# Patient Record
Sex: Male | Born: 1975 | Race: White | Hispanic: No | Marital: Married | Smoking: Never smoker
Health system: Southern US, Community
[De-identification: ages and names within clinical notes are randomized; demographics above are authoritative.]

---

## 2019-04-21 DIAGNOSIS — Z0000 Encounter for general adult medical examination without abnormal findings: Secondary | ICD-10-CM | POA: Diagnosis not present

## 2019-04-21 DIAGNOSIS — M222X2 Patellofemoral disorders, left knee: Secondary | ICD-10-CM | POA: Insufficient documentation

## 2019-05-13 ENCOUNTER — Other Ambulatory Visit: Payer: Self-pay

## 2019-05-13 ENCOUNTER — Ambulatory Visit: Payer: Self-pay

## 2019-05-13 ENCOUNTER — Encounter: Payer: Self-pay | Admitting: Family Medicine

## 2019-05-13 ENCOUNTER — Ambulatory Visit: Payer: BC Managed Care – PPO | Admitting: Family Medicine

## 2019-05-13 VITALS — BP 130/90 | HR 88 | Ht 70.0 in | Wt 169.2 lb

## 2019-05-13 DIAGNOSIS — M25561 Pain in right knee: Secondary | ICD-10-CM | POA: Diagnosis not present

## 2019-05-13 DIAGNOSIS — M25562 Pain in left knee: Secondary | ICD-10-CM

## 2019-05-13 NOTE — Progress Notes (Signed)
Subjective:    CC: B knee pain, L>R  I, Molly Weber, LAT, ATC, am serving as scribe for Dr. Lynne Leader.  HPI: Pt is a 44 y/o male presenting w/ c/o B knee pain L>R  x 4-6 weeks w/ no known MOI.  He states that he has been running over the past year and has recently had increased pain w/ running to the point that he has had to stop running.  He was running up to 20-25 miles/week prior to stopping due to the pain.  He rates his pain at a 4-5/10 and describes his pain as sharp w/ running and stiff and aching when sitting for a prolonged period of time.  He normally runs about 25 miles per week.  He has had to reduce or stop running as a result of his pain.  He does note that about 10 years ago his right knee twisted and he felt a painful pop.  This was sore for a few weeks but then resolved without a lot of pain until recently.  Radiating pain: No Knee swelling: No Mechanical knee symptoms: Yes Aggravating factors: Running; stair climbing; prolonged sitting Treatments tried: Meloxicam  Diagnostic imaging:  Pertinent review of Systems: No fevers or chills  Relevant historical information: No significant past medical history noted in chart.   Objective:    Vitals:   05/13/19 1247  BP: 130/90  Pulse: 88  SpO2: 98%   General: Well Developed, well nourished, and in no acute distress.   MSK:  Right knee: Normal-appearing without effusion erythema. Range of motion 0-120 degrees without crepitation. Mildly tender palpation anterior medial joint line.  Otherwise nontender Stable ligamentous exam. Negative McMurray's test. Intact strength to flexion and extension.  Left knee: Normal-appearing no swelling effusion or erythema. Mildly tender palpation overlying proximal patellar tendon.  Palpable squeak present overlying proximal patellar tendon. Stable ligamentous exam. Negative Murray's test. Intact strength to flexion extension.  Right hip: Normal-appearing Normal  motion. Nontender. Strength 4+/5 abduction.  5/5 external rotation.  5/5 internal rotation and adduction.  Left hip: Normal-appearing Normal motion. Nontender. Strength: 4/5 abduction.  5/5 external rotation.  5/5 internal rotation and adduction.  Leg lengths equal. Normal gait.  Lab and Radiology Results Diagnostic Limited MSK Ultrasound of: Right knee Quad tendon: Intact normal-appearing no significant joint effusion. Patellar tendon intact normal-appearing.  Trace hypoechoic fluid tracking deep to patellar tendon at insertion onto tibia. Medial meniscus with hypoechoic change mid substance consistent with radial tear.  No displacement of meniscus or extrusion of meniscus present. Lateral joint line normal-appearing. Impression: Probable old medial meniscus tear  Diagnostic Limited MSK Ultrasound of: Left knee Quad tendon normal-appearing no significant joint effusion. Patellar tendon is normal-appearing however hypoechoic fluid tracking superficial to patellar tendon consistent with prepatellar bursitis. Medial joint line and lateral joint line normal-appearing with no obvious meniscus tear Impression: Prepatellar bursitis    Impression and Recommendations:    Assessment and Plan: 44 y.o. male with bilateral knee pain.   Left knee pain: Predominantly prepatellar bursitis.  Patient also likely has a component of patellofemoral pain syndrome.  Trial of Voltaren gel.  Work on quad strength and hip abductor strength.   Right knee pain: Ongoing for a few weeks.  Patient has a history that is concerning for possible meniscus tear occurring about 10 years ago.  His pain started recently.  I believe the pain in his right knee is due to compensatory gait from his left knee pain.  He  probably does have a meniscus tear however is not been bothering him previously very much.  Watchful waiting trial Voltaren gel treating left knee pain..  Check back 4 to 6 weeks.  Orders Placed This  Encounter  Procedures  . [place] LIMITED JOINT SPACE STRUCTURES LOW BILAT(NO LINKED CHARGES)    Order Specific Question:   Reason for Exam (SYMPTOM  OR DIAGNOSIS REQUIRED)    Answer:   B knee pain    Order Specific Question:   Preferred imaging location?    Answer:   [facility]   No orders of the defined types were placed in this encounter.   Discussed warning signs or symptoms. Please see discharge instructions. Patient expresses understanding.   The above documentation has been reviewed and is accurate and complete Clementeen Graham

## 2019-05-13 NOTE — Patient Instructions (Addendum)
Thank you for coming in today. I think the issue with the left knee is pre-patellar bursitis and some patellofemoral pain syndrome.  The right knee may have a small medial meniscus tear.  Try using over the counter voltaren gel up to 4 x daily  Avoid lot of pressure or impact on the front of your left knee Do the exercises Molly reviewed.  Ok to do return to running.  Pay attention to pain or limping.  Start at about 50% of pre-injury workload and advance 10% per week.  If not doing well let me know. May get xray and start formal PT.  Recheck in 4-6 weeks.  Let me know sooner if not doing ok.   Please perform the exercise program that we have prepared for you and gone over in detail on a daily basis.  In addition to the handout you were provided you can access your program through: [email or web address]   Your unique program code is:  [email or web address]

## 2019-06-13 ENCOUNTER — Ambulatory Visit (INDEPENDENT_AMBULATORY_CARE_PROVIDER_SITE_OTHER): Payer: BC Managed Care – PPO

## 2019-06-13 ENCOUNTER — Ambulatory Visit: Payer: BC Managed Care – PPO | Admitting: Family Medicine

## 2019-06-13 ENCOUNTER — Other Ambulatory Visit: Payer: Self-pay

## 2019-06-13 ENCOUNTER — Ambulatory Visit: Payer: Self-pay

## 2019-06-13 VITALS — BP 122/88 | Ht 70.0 in | Wt 169.0 lb

## 2019-06-13 DIAGNOSIS — M25562 Pain in left knee: Secondary | ICD-10-CM

## 2019-06-13 DIAGNOSIS — M222X1 Patellofemoral disorders, right knee: Secondary | ICD-10-CM | POA: Insufficient documentation

## 2019-06-13 DIAGNOSIS — M25561 Pain in right knee: Secondary | ICD-10-CM

## 2019-06-13 DIAGNOSIS — M7042 Prepatellar bursitis, left knee: Secondary | ICD-10-CM | POA: Insufficient documentation

## 2019-06-13 DIAGNOSIS — M1711 Unilateral primary osteoarthritis, right knee: Secondary | ICD-10-CM | POA: Diagnosis not present

## 2019-06-13 DIAGNOSIS — M1712 Unilateral primary osteoarthritis, left knee: Secondary | ICD-10-CM | POA: Diagnosis not present

## 2019-06-13 NOTE — Progress Notes (Signed)
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

## 2019-06-13 NOTE — Progress Notes (Signed)
I, [name], LAT, ATC, am serving as scribe for Dr. Clementeen Graham.  Peter Erickson is a 44 y.o. male who presents to [facility] today for follow-up bilateral knee pain.  Patient was seen on February 23 for bilateral knee pain.  Left knee: Thought to be predominantly prepatellar bursitis.  Additionally he was thought to have a component of patellofemoral pain syndrome.  He had trial of Voltaren gel and quad and hip abductor strengthening.  In the interim he notes continued pain mostly at the anterior aspect the knee.  Pain is also obnoxious with kneeling.  Right knee: At the last visit pain would have been ongoing for few weeks.  He was thought to have perhaps an old meniscus tear about 10 years ago and thought pain was possibly due to compensatory gait due to his left knee pain.  Again plan for Voltaren gel and watchful waiting.  In the interim he notes continued pain.  He is unable to run normally because of this knee pain.   Pertinent review of systems: No fevers or chills  Relevant historical information: No significant past medical history noted in chart.   Exam:  BP 122/88   Ht 5\' 10"  (1.778 m)   Wt 169 lb (76.7 kg)   BMI 24.25 kg/m  General: Well Developed, well nourished, and in no acute distress.   MSK:  Right knee: Normal-appearing Normal motion Nontender. Normal strength. Stable ligaments exam. Negative Murray's test.  Left knee: Normal-appearing Tender palpation overlying proximal patellar tendon and distal patella. Minimal palpable squeak present. Stable ligamentous exam.  Negative McMurray's test. Intact flexion extension strength.    Lab and Radiology Results No results found for this or any previous visit (from the past 72 hour(s)). DG Knee AP/LAT W/Sunrise Left  Result Date: 06/13/2019 CLINICAL DATA:  Chronic pain EXAM: LEFT KNEE 3 VIEWS COMPARISON:  None. FINDINGS: Frontal, lateral, tunnel, and sunrise patellar images  were obtained. There is no evident fracture or dislocation. No joint effusion. There is slight narrowing medially. Other joint spaces appear normal. No erosive change or intra-articular calcification. IMPRESSION: Slight joint space narrowing medially. Joint spaces elsewhere appear normal. No evident fracture, dislocation, or joint effusion. Electronically Signed   By: 06/15/2019 III M.D.   On: 06/13/2019 09:11   DG Knee AP/LAT W/Sunrise Right  Result Date: 06/13/2019 CLINICAL DATA:  Chronic pain EXAM: RIGHT KNEE 3 VIEWS COMPARISON:  None. FINDINGS: Frontal, lateral, tunnel, and sunrise patellar images were obtained. No fracture or dislocation. No joint effusion. There is minimal narrowing medially. Other joint spaces appear normal. No erosive change or intra-articular calcification. IMPRESSION: Rather minimal narrowing medially. Joint spaces otherwise appear normal. No fracture, dislocation, or effusion. Electronically Signed   By: 06/15/2019 III M.D.   On: 06/13/2019 09:12  I, 06/15/2019, personally (independently) visualized and performed the interpretation of the images attached in this note.   Procedure: Real-time Ultrasound Guided Injection of left knee prepatellar bursa Device: Philips Affiniti 50G Images permanently stored and available for review in the ultrasound unit. Verbal informed consent obtained.  Discussed risks and benefits of procedure. Warned about infection bleeding damage to structures skin hypopigmentation and fat atrophy among others. Patient expresses understanding and agreement Time-out conducted.   Noted no overlying erythema, induration, or other signs of local infection.   Skin prepped in a sterile fashion.   Local anesthesia: Topical Ethyl chloride.   With sterile technique and under real time ultrasound guidance:  40 mg of Kenalog and 1 mL of Marcaine injected easily.   Completed without difficulty   Pain immediately resolved suggesting accurate  placement of the medication.   Advised to call if fevers/chills, erythema, induration, drainage, or persistent bleeding.   Images permanently stored and available for review in the ultrasound unit.  Impression: Technically successful ultrasound guided injection.        Assessment and Plan: 44 y.o. male with bilateral knee pain Left knee pain: Predominantly due to prepatellar bursitis.  Failing initial conservative management.  Plan for steroid injection today.  Continue Voltaren gel.  Add compressive sleeve body helix full knee.  Add physical therapy.  X-ray obtained today.  Recheck back as needed.  Right knee pain: Femoral pain syndrome.  I suspect part of this is due to compensatory gait due to left knee pain.  Failing initial management.  X-ray obtained today.  Plan for trial of physical therapy.  If not improved would consider MRI to evaluate for possible meniscus injury.  PDMP not reviewed this encounter. Orders Placed This Encounter  Procedures  . [place] LIMITED JOINT SPACE STRUCTURES LOW BILAT(NO LINKED CHARGES)    Order Specific Question:   Reason for Exam (SYMPTOM  OR DIAGNOSIS REQUIRED)    Answer:   B knee pain    Order Specific Question:   Preferred imaging location?    Answer:   [place]  . DG Knee AP/LAT W/Sunrise Left    Standing Status:   Future    Number of Occurrences:   1    Standing Expiration Date:   08/12/2020    Order Specific Question:   Reason for Exam (SYMPTOM  OR DIAGNOSIS REQUIRED)    Answer:   B knee pain    Order Specific Question:   Preferred imaging location?    Answer:   [facility]  . DG Knee AP/LAT W/Sunrise Right    Standing Status:   Future    Number of Occurrences:   1    Standing Expiration Date:   08/12/2020    Order Specific Question:   Reason for Exam (SYMPTOM  OR DIAGNOSIS REQUIRED)    Answer:   B knee pain    Order Specific Question:   Preferred imaging location?    Answer:   [facility]  .  Ambulatory referral to Physical Therapy    Referral Priority:   Routine    Referral Type:   Physical Medicine    Referral Reason:   Specialty Services Required    Requested Specialty:   Physical Therapy    Number of Visits Requested:   1   No orders of the defined types were placed in this encounter.    Discussed warning signs or symptoms. Please see discharge instructions. Patient expresses understanding.   The above documentation has been reviewed and is accurate and complete Lynne Leader

## 2019-06-13 NOTE — Progress Notes (Signed)
Minimal arthritis inside part of the knee otherwise normal

## 2019-06-13 NOTE — Patient Instructions (Signed)
Please get knee x-rays on your way out.  I recommend you obtained a compression sleeve to help with your joint problems. There are many options on the market however I recommend obtaining a knee Body Helix compression sleeve.  You can find information (including how to appropriate measure yourself for sizing) can be found at www.Body [email or web address].ch.  Many of these products are health savings account (HSA) eligible.   You can use the compression sleeve at any time throughout the day but is most important to use while being active as well as for 2 hours post-activity.   It is appropriate to ice following activity with the compression sleeve in place.  We have referred you to outpatient PT at [facility].

## 2019-07-01 ENCOUNTER — Other Ambulatory Visit: Payer: Self-pay

## 2019-07-01 ENCOUNTER — Ambulatory Visit: Payer: BC Managed Care – PPO | Attending: Family Medicine

## 2019-07-01 DIAGNOSIS — M25562 Pain in left knee: Secondary | ICD-10-CM | POA: Insufficient documentation

## 2019-07-01 DIAGNOSIS — R293 Abnormal posture: Secondary | ICD-10-CM | POA: Diagnosis not present

## 2019-07-01 DIAGNOSIS — M25561 Pain in right knee: Secondary | ICD-10-CM | POA: Diagnosis not present

## 2019-07-01 DIAGNOSIS — M6281 Muscle weakness (generalized): Secondary | ICD-10-CM | POA: Diagnosis not present

## 2019-07-01 NOTE — Therapy (Signed)
[facility] [address] [place], [place], [address] Phone: [phone]   Fax:  [phone]  Physical Therapy Evaluation  Patient Details  Name: Peter Erickson MRN: [id number] Date of Birth: Aug 11, 1975 Referring Provider (PT): Jenita Seashore, MD   Encounter Date: 07/01/2019  PT End of Session - 07/01/19 1652    Visit Number  1    Number of Visits  8    Date for PT Re-Evaluation  08/12/19    Authorization Type  BC[employer]    PT Start Time  1315    PT Stop Time  1403    PT Time Calculation (min)  48 min    Activity Tolerance  Patient tolerated treatment well    Behavior During Therapy  [facility] for tasks assessed/performed       History reviewed. No pertinent past medical history.  History reviewed. No pertinent surgical history.  There were no vitals filed for this visit.   Subjective Assessment - 07/01/19 1311    Subjective  Pt reports he started running during the pandemic and went for months until he started having L knee pain. He thought it was his shoes, but it didn't really help. He went for a 6-7 mile run and had excruciating pain in L knee going up/down stairs, but also started having some meidal R knee pain. Ultrasound revealed potential R medial meniscal tear and prepatellar bursitis in L knee. He received an injection in L knee which took about a week to take effect. He is now able to take short runs for a few miles - 2-3 runs since injection with no pain after 1st run of 2 miles and Sunday was a 3 mile run and 3 mile walk back with pain 2 days later. He wears [employer] 860's to run. He reports starting by building to 3 miles during the week and then was up to 4-6 miles, then he was starting at 6 mile long runs on the weekends and ramped it up to 13.    Currently in Pain?  Yes    Pain Score  1     Pain Location  Knee    Pain Orientation  Left    Pain Descriptors / Indicators  Aching    Pain Type  Acute pain    Pain  Onset  More than a month ago         [facility] PT Assessment - 07/01/19 0001      Assessment   Medical Diagnosis  B Knee Pain    Referring Provider (PT)  Jenita Seashore, MD    Onset Date/Surgical Date  03/02/19    Hand Dominance  Right      Balance Screen   Has the patient fallen in the past 6 months  No    Has the patient had a decrease in activity level because of a fear of falling?   No    Is the patient reluctant to leave their home because of a fear of falling?   No      ROM / Strength   AROM / PROM / Strength  AROM;PROM;Strength      AROM   Overall AROM   Within functional limits for tasks performed    Overall AROM Comments  --    AROM Assessment Site  Knee    Right/Left Knee  Right;Left    Right Knee Extension  -2    Right Knee Flexion  138    Left  Knee Extension  -2    Left Knee Flexion  140      PROM   Overall PROM Comments  hyperextension to B knees, Hip WFL, firm end feels with spontaneous manipulation to L hip with PROM hip IR testing    PROM Assessment Site  Knee    Right/Left Knee  Right;Left    Right Knee Extension  -5    Right Knee Flexion  145    Left Knee Extension  -5    Left Knee Flexion  148      Strength   Strength Assessment Site  Hip;Knee    Right/Left Hip  Right;Left    Right Hip Flexion  4/5    Right Hip Extension  4-/5    Right Hip External Rotation   4-/5    Right Hip Internal Rotation  4-/5    Right Hip ABduction  4/5   hip flexor compensation   Left Hip Flexion  4/5    Left Hip Extension  3+/5    Left Hip External Rotation  4-/5    Left Hip Internal Rotation  3+/5    Left Hip ABduction  4-/5    Right/Left Knee  Right;Left    Right Knee Flexion  4+/5    Right Knee Extension  4+/5    Left Knee Flexion  4+/5    Left Knee Extension  4+/5      Flexibility   Soft Tissue Assessment /Muscle Length  yes    Quadriceps  hypoextensibility    ITB  + Ober's, excessive B hip ER in supine lying      Palpation   Patella mobility  hypermobility  L patellar, decreased mobility M/L R patella    Palpation comment  no pain with PROM and OP into flex/ext B knees, TTP to L patella      Ambulation/Gait   Gait Comments  Running Analysis: increased lumbar lordosis, decreased anterior lean with increased vertical translaition, heel striker, increased supination R foot                Objective measurements completed on examination: See above findings.      [facility] PT Treatment/Exercise - 07/01/19 0001      Exercises   Exercises  Knee/Hip      Knee/Hip Exercises: Stretches   Other Knee/Hip Stretches  Dynamic Warm-up: leaning against wall in plank with high knees x 30 seconds, in plank one leg swings into IR/ER allowing the mobility at opposite ankle joint, standing with swings M/L and A/P      Knee/Hip Exercises: Supine   Other Supine Knee/Hip Exercises  PPT 10 x 5" due to excessive anterior tilt              PT Education - 07/01/19 1651    Education Details  HEP, POC, adapting load during running, backing off on running, adding in strengthening/restorative exercise    Person(s) Educated  Patient    Methods  Explanation;Handout;Verbal cues;Tactile cues;Demonstration   emailed HEP   Comprehension  Verbalized understanding;Verbal cues required;Tactile cues required       PT Short Term Goals - 07/01/19 1713      PT SHORT TERM GOAL #1   Title  Pt will demonstrate ability to perform bridge, maintaining posterior pelvic tilt without pain.    Baseline  difficulty achieving posterior tilt in H/L with back imprinted, painful into low bridge    Time  2    Period  Weeks  Status  New    Target Date  07/15/19      PT SHORT TERM GOAL #2   Title  Pt will increase L hip abduction strength to 4+/5 for improved HKA alignment.    Baseline  3+/5    Time  4    Status  New    Target Date  07/29/19      PT SHORT TERM GOAL #3   Title  Pt will increase hip extension strength to 4+/5 B.    Baseline  3+/5 L, 4-/5 R     Time  4    Period  Weeks    Status  New    Target Date  07/29/19        PT Long Term Goals - 07/01/19 1715      PT LONG TERM GOAL #1   Title  Pt will be able to return to running without pain, understanding how to adapt load progressively over time for injury prevention.    Baseline  Pain post run    Time  6    Period  Weeks    Status  New    Target Date  08/12/19      PT LONG TERM GOAL #2   Title  Pt will demonstrate 10 single leg squats B with proper form and mechanics, pain free.    Time  6    Period  Weeks    Status  New    Target Date  08/12/19      PT LONG TERM GOAL #3   Title  Pt will have no knee pain negotiating stairs for home and community access.    Baseline  Pain    Time  6    Period  Weeks    Status  New    Target Date  08/12/19             Plan - 07/01/19 1653    Clinical Impression Statement  Pt is a 44 yo male who presents to clinic with c/o B knee pain, L>R. Pt began running last year during pandemic and progressively increased distance with significant jump from 6 to 13 miles at one point and no report of load adaptation. Pt had abrupt onset of L knee pain with gradual onset of R knee pain, likely overuse in nature. Pt participates in power yoga once/week and minimal stretching along with running 3-4x/week, though recently has cut back due to pain. Pt demonstrates weakness in B hips/posterior chain L>R and core with significant difficulty achieving posterior tilt in supine and c/o low back pain with bridging. He was educated on physical therapy POC, diagnosis, prognosis, and HEP - pt verbalized understanding and consented to treatment.    Personal Factors and Comorbidities  Time since onset of injury/illness/exacerbation;Fitness    Examination-Activity Limitations  Stairs;Locomotion Level;Squat    Examination-Participation Restrictions  Community Activity;Other   running   Stability/Clinical Decision Making  Stable/Uncomplicated    Clinical Decision  Making  Low    Rehab Potential  Excellent    PT Frequency  2x / week   2x/week for 2-4 weeks, 1x/week for 2-4 weeks   PT Duration  6 weeks    PT Treatment/Interventions  Cryotherapy;Electrical Stimulation;Iontophoresis 4mg /ml Dexamethasone;Moist Heat;Stair training;Gait training;Functional mobility training;Therapeutic activities;Neuromuscular re-education;Balance training;Therapeutic exercise;Patient/family education;Dry needling;Passive range of motion;Manual techniques;Joint Manipulations;Vasopneumatic Device    PT Next Visit Plan  Assess HEP, continue to progress static glute and core strength, myofascial restrictions to quads    PT Home  Exercise Plan  [id number] - dynamic warm up for running: plank against wall high knees x 30 seconds, plank against wall and IR/ER hip x 20 B, hip swings AP/ML x 20 each, PPT 1 x 15-20 x 5 seconds    Consulted and Agree with Plan of Care  Patient       Patient will benefit from skilled therapeutic intervention in order to improve the following deficits and impairments:  Hypermobility, Decreased strength, Increased fascial restricitons, Impaired flexibility, Postural dysfunction, Pain, Improper body mechanics, Impaired perceived functional ability, Decreased activity tolerance  Visit Diagnosis: Left knee pain, unspecified chronicity - Plan: PT plan of care cert/re-cert  Right knee pain, unspecified chronicity - Plan: PT plan of care cert/re-cert  Muscle weakness (generalized) - Plan: PT plan of care cert/re-cert  Posture abnormality - Plan: PT plan of care cert/re-cert     Problem List Patient Active Problem List   Diagnosis Date Noted  . Prepatellar bursitis, left knee 06/13/2019  . Patellofemoral pain syndrome of right knee 06/13/2019  . Patellofemoral pain syndrome of left knee 04/21/2019    Marcelline Mates, PT, DPT 07/01/2019, 5:21 PM  [facility] [address] [place], [place],  [address] Phone: [phone]   Fax:  [phone]  Name: Peter Erickson MRN: [id number] Date of Birth: May 20, 1975

## 2019-07-07 ENCOUNTER — Encounter: Payer: Self-pay | Admitting: Physical Therapy

## 2019-07-07 ENCOUNTER — Other Ambulatory Visit: Payer: Self-pay

## 2019-07-07 ENCOUNTER — Ambulatory Visit: Payer: BC Managed Care – PPO | Admitting: Physical Therapy

## 2019-07-07 DIAGNOSIS — R293 Abnormal posture: Secondary | ICD-10-CM | POA: Diagnosis not present

## 2019-07-07 DIAGNOSIS — M6281 Muscle weakness (generalized): Secondary | ICD-10-CM

## 2019-07-07 DIAGNOSIS — M25561 Pain in right knee: Secondary | ICD-10-CM

## 2019-07-07 DIAGNOSIS — M25562 Pain in left knee: Secondary | ICD-10-CM | POA: Diagnosis not present

## 2019-07-07 NOTE — Patient Instructions (Signed)
Access Code: [email or web address] URL: [email or web address] Date: 07/07/2019 Prepared by: Addilyne Backs  Exercises The Diver - 3-4 x weekly - 3 sets - 10 reps Single Leg Sit to Stand with Arms Extended - 3-4 x weekly - 3 sets - 10 reps Side Plank on Elbow with Hip Abduction - 3-4 x weekly - 3 sets - 10 reps  

## 2019-07-07 NOTE — Therapy (Signed)
[facility] [place] [address] [place], [place], [address] Phone: [phone]   Fax:  [phone]  Physical Therapy Treatment  Patient Details  Name: Peter Erickson MRN: [id number] Date of Birth: 09-07-1975 Referring Provider (PT): Peter Seashore, MD   Encounter Date: 07/07/2019  PT End of Session - 07/07/19 1020    Visit Number  2    Number of Visits  8    Date for PT Re-Evaluation  08/12/19    Authorization Type  BCBS    PT Start Time  1021    PT Stop Time  1103    PT Time Calculation (min)  42 min    Activity Tolerance  Patient tolerated treatment well    Behavior During Therapy  [facility] for tasks assessed/performed       History reviewed. No pertinent past medical history.  History reviewed. No pertinent surgical history.  There were no vitals filed for this visit.  Subjective Assessment - 07/07/19 1021    Subjective  Pt reports he is doing his HEP without problems.  He went hiking over the weekend, strenous and has some Rt groin issues now.  Knees are good today    Currently in Pain?  Yes    Pain Score  3     Pain Location  Groin    Pain Orientation  Right    Pain Descriptors / Indicators  Aching    Pain Type  Acute pain    Pain Onset  In the past 7 days    Pain Frequency  Intermittent                       [facility] Adult PT Treatment/Exercise - 07/07/19 0001      Knee/Hip Exercises: Stretches   Lobbyist  Both;2 reps;60 seconds   prone with strap   ITB Stretch  Both;1 rep;30 seconds   cross body with strap     Knee/Hip Exercises: Aerobic   Elliptical  L5x4' alt FWD/BWD      Knee/Hip Exercises: Standing   SLS  with FWD leans 2x10    SLS with Vectors  10 reps      Knee/Hip Exercises: Seated   Sit to Sand  20 reps;without UE support   each side, higher bed for Lt LE d/t pain and form     Knee/Hip Exercises: Sidelying   Other Sidelying Knee/Hip Exercises  side plank with hip abdcution               PT Education - 07/07/19 1052    Education Details  HEP progression    Person(s) Educated  Patient    Methods  Explanation;Demonstration    Comprehension  Verbalized understanding;Returned demonstration       PT Short Term Goals - 07/01/19 1713      PT SHORT TERM GOAL #1   Title  Pt will demonstrate ability to perform bridge, maintaining posterior pelvic tilt without pain.    Baseline  difficulty achieving posterior tilt in H/L with back imprinted, painful into low bridge    Time  2    Period  Weeks    Status  New    Target Date  07/15/19      PT SHORT TERM GOAL #2   Title  Pt will increase L hip abduction strength to 4+/5 for improved HKA alignment.    Baseline  3+/5    Time  4    Status  New    Target Date  07/29/19      PT SHORT TERM GOAL #3   Title  Pt will increase hip extension strength to 4+/5 B.    Baseline  3+/5 L, 4-/5 R    Time  4    Period  Weeks    Status  New    Target Date  07/29/19        PT Long Term Goals - 07/01/19 1715      PT LONG TERM GOAL #1   Title  Pt will be able to return to running without pain, understanding how to adapt load progressively over time for injury prevention.    Baseline  Pain post run    Time  6    Period  Weeks    Status  New    Target Date  08/12/19      PT LONG TERM GOAL #2   Title  Pt will demonstrate 10 single leg squats B with proper form and mechanics, pain free.    Time  6    Period  Weeks    Status  New    Target Date  08/12/19      PT LONG TERM GOAL #3   Title  Pt will have no knee pain negotiating stairs for home and community access.    Baseline  Pain    Time  6    Period  Weeks    Status  New    Target Date  08/12/19            Plan - 07/07/19 1100    Clinical Impression Statement  This is Marks first tx since the eval.  He aggrivated his Rt groin hiking over the weekend, it did not flare up with any of the exercise we did today.  His Lt LE is very weak compared to the Rt  and has a lot of instabilty with SLS activites.    Rehab Potential  Excellent    PT Frequency  2x / week    PT Duration  6 weeks    PT Treatment/Interventions  Cryotherapy;Electrical Stimulation;Iontophoresis 4mg /ml Dexamethasone;Moist Heat;Stair training;Gait training;Functional mobility training;Therapeutic activities;Neuromuscular re-education;Balance training;Therapeutic exercise;Patient/family education;Dry needling;Passive range of motion;Manual techniques;Joint Manipulations;Vasopneumatic Device    PT Next Visit Plan  Assess HEP, continue to progress static glute and core strength, myofascial restrictions to quads    Consulted and Agree with Plan of Care  Patient       Patient will benefit from skilled therapeutic intervention in order to improve the following deficits and impairments:  Hypermobility, Decreased strength, Increased fascial restricitons, Impaired flexibility, Postural dysfunction, Pain, Improper body mechanics, Impaired perceived functional ability, Decreased activity tolerance  Visit Diagnosis: Left knee pain, unspecified chronicity  Right knee pain, unspecified chronicity  Muscle weakness (generalized)  Posture abnormality     Problem List Patient Active Problem List   Diagnosis Date Noted  . Prepatellar bursitis, left knee 06/13/2019  . Patellofemoral pain syndrome of right knee 06/13/2019  . Patellofemoral pain syndrome of left knee 04/21/2019    Jeral Pinch PT 07/07/2019, 11:06 AM  [facility] [place], [place], [address] Phone: [phone]   Fax:  [phone]  Name: Peter Erickson MRN: [id number] Date of Birth: 01/16/1976

## 2019-07-09 ENCOUNTER — Ambulatory Visit: Payer: BC Managed Care – PPO | Admitting: Physical Therapy

## 2019-07-09 ENCOUNTER — Encounter: Payer: Self-pay | Admitting: Physical Therapy

## 2019-07-09 ENCOUNTER — Other Ambulatory Visit: Payer: Self-pay

## 2019-07-09 DIAGNOSIS — M25562 Pain in left knee: Secondary | ICD-10-CM | POA: Diagnosis not present

## 2019-07-09 DIAGNOSIS — M25561 Pain in right knee: Secondary | ICD-10-CM

## 2019-07-09 DIAGNOSIS — R293 Abnormal posture: Secondary | ICD-10-CM | POA: Diagnosis not present

## 2019-07-09 DIAGNOSIS — M6281 Muscle weakness (generalized): Secondary | ICD-10-CM

## 2019-07-09 NOTE — Therapy (Signed)
[facility] [place] [address] [place], [place], [address] Phone: [phone]   Fax:  [phone]  Physical Therapy Treatment  Patient Details  Name: Peter Erickson MRN: [id number] Date of Birth: [birth year] Referring Provider (PT): Jenita Seashore, MD   Encounter Date: 07/09/2019  PT End of Session - 07/09/19 1407    Visit Number  3    Number of Visits  8    Date for PT Re-Evaluation  08/12/19    Authorization Type  BCBS    PT Start Time  1315    PT Stop Time  1400    PT Time Calculation (min)  45 min    Activity Tolerance  Patient tolerated treatment well    Behavior During Therapy  [facility] for tasks assessed/performed       History reviewed. No pertinent past medical history.  History reviewed. No pertinent surgical history.  There were no vitals filed for this visit.  Subjective Assessment - 07/09/19 1314    Subjective  Pt reports the pain in his R groin has resolved; states L knee is a little sore after single leg squats during last rx.    Currently in Pain?  Yes    Pain Score  3     Pain Location  Knee    Pain Orientation  Left                       [facility] Adult PT Treatment/Exercise - 07/09/19 0001      Knee/Hip Exercises: Stretches   Quad Stretch  Both;60 seconds;1 rep      Knee/Hip Exercises: Aerobic   Elliptical  L5x6' alt fwd/bwd      Knee/Hip Exercises: Machines for Strengthening   Cybex Leg Press  60# BLE 2x10, 40# SLE 2x10 bilat      Knee/Hip Exercises: Standing   Heel Raises  Both;1 set;15 reps    Step Down  Both;2 sets;10 reps;Step Height: 6"    Step Down Limitations  cues for eccentric control on L. Height changed from 8" to 6" d/t pt report of L knee pain    Functional Squat  2 sets;10 reps    Functional Squat Limitations  single limb squat to mat table (pillows to raise height d/t reports of L knee pain)    SLS  with FWD leans 2x10    Walking with Sports Cord  resisted walking  50# each direction x3    Other Standing Knee Exercises  bosu ball squat x10 with cues to limit depth d/t L knee pain      Knee/Hip Exercises: Supine   Bridges  Both;1 set;10 reps   with cues for PPT and core activation   Single Leg Bridge  Both;1 set;10 reps    Other Supine Knee/Hip Exercises  Birddog with 5 sec hold x5 reps bilat      Knee/Hip Exercises: Sidelying   Other Sidelying Knee/Hip Exercises  side plank with hip abduction             PT Education - 07/09/19 1406    Education Details  Discussed HEP progression. Pt educated on running progresssion at home (encouraged to stop before point of pain in L knee).    Person(s) Educated  Patient    Methods  Explanation    Comprehension  Verbalized understanding       PT Short Term Goals - 07/01/19 1713      PT SHORT TERM GOAL #1  Title  Pt will demonstrate ability to perform bridge, maintaining posterior pelvic tilt without pain.    Baseline  difficulty achieving posterior tilt in H/L with back imprinted, painful into low bridge    Time  2    Period  Weeks    Status  New    Target Date  07/15/19      PT SHORT TERM GOAL #2   Title  Pt will increase L hip abduction strength to 4+/5 for improved HKA alignment.    Baseline  3+/5    Time  4    Status  New    Target Date  07/29/19      PT SHORT TERM GOAL #3   Title  Pt will increase hip extension strength to 4+/5 B.    Baseline  3+/5 L, 4-/5 R    Time  4    Period  Weeks    Status  New    Target Date  07/29/19        PT Long Term Goals - 07/01/19 1715      PT LONG TERM GOAL #1   Title  Pt will be able to return to running without pain, understanding how to adapt load progressively over time for injury prevention.    Baseline  Pain post run    Time  6    Period  Weeks    Status  New    Target Date  08/12/19      PT LONG TERM GOAL #2   Title  Pt will demonstrate 10 single leg squats B with proper form and mechanics, pain free.    Time  6    Period  Weeks     Status  New    Target Date  08/12/19      PT LONG TERM GOAL #3   Title  Pt will have no knee pain negotiating stairs for home and community access.    Baseline  Pain    Time  6    Period  Weeks    Status  New    Target Date  08/12/19            Plan - 07/09/19 1408    Clinical Impression Statement  Pt reports that R groin pain has resolved; had to alter SLS at home d/t L knee pain during exercise. Progressed LE strengthening ex this rx; pt tolerated well. Pt reports being able to run ~45mile before L knee pain begins; continue working towards progression of running program.    PT Treatment/Interventions  Cryotherapy;Electrical Stimulation;Iontophoresis 4mg /ml Dexamethasone;Moist Heat;Stair training;Gait training;Functional mobility training;Therapeutic activities;Neuromuscular re-education;Balance training;Therapeutic exercise;Patient/family education;Dry needling;Passive range of motion;Manual techniques;Joint Manipulations;Vasopneumatic Device    PT Next Visit Plan  Assess HEP, continue to progress static glute and core strength, myofascial restrictions to quads. Initiate running training/analysis in clinic as indicated.    Consulted and Agree with Plan of Care  Patient       Patient will benefit from skilled therapeutic intervention in order to improve the following deficits and impairments:  Hypermobility, Decreased strength, Increased fascial restricitons, Impaired flexibility, Postural dysfunction, Pain, Improper body mechanics, Impaired perceived functional ability, Decreased activity tolerance  Visit Diagnosis: Left knee pain, unspecified chronicity  Right knee pain, unspecified chronicity  Muscle weakness (generalized)  Posture abnormality     Problem List Patient Active Problem List   Diagnosis Date Noted  . Prepatellar bursitis, left knee 06/13/2019  . Patellofemoral pain syndrome of right knee 06/13/2019  . Patellofemoral pain  syndrome of left knee  04/21/2019   Amador Cunas, PT, DPT Donald Prose Kamylah Manzo 07/09/2019, 2:12 PM  [place], [place], [address] Phone: [phone]   Fax:  [phone]  Name: Chance Munter MRN: [id number] Date of Birth: [birth year]

## 2019-07-14 ENCOUNTER — Other Ambulatory Visit: Payer: Self-pay

## 2019-07-14 ENCOUNTER — Encounter: Payer: Self-pay | Admitting: Physical Therapy

## 2019-07-14 ENCOUNTER — Ambulatory Visit: Payer: BC Managed Care – PPO | Admitting: Physical Therapy

## 2019-07-14 DIAGNOSIS — M6281 Muscle weakness (generalized): Secondary | ICD-10-CM | POA: Diagnosis not present

## 2019-07-14 DIAGNOSIS — M25562 Pain in left knee: Secondary | ICD-10-CM

## 2019-07-14 DIAGNOSIS — M25561 Pain in right knee: Secondary | ICD-10-CM

## 2019-07-14 DIAGNOSIS — R293 Abnormal posture: Secondary | ICD-10-CM

## 2019-07-14 NOTE — Patient Instructions (Signed)
[id number]: Emailed pt the following program of daily stretches to be incorporated into flexibility routine along with pre/post run stretches.  Provided pt with the following return to running protocol and instructed to start on Level 3 or 4: LEVEL 1 0.1 mile walk / 0.1 mile jog- repeat 10 times LEVEL 2 Alternate 0.1 mile walk / 0.2 mile jog - 2 mile total  LEVEL 3 Alternate 0.1 mile walk / 0.3 mile jog - 2 mile total LEVEL 4 Alternate 0.1 mile walk / 0.4 mile jog - 2 mile total LEVEL 5 jog 2 miles LEVEL 6 Increase workout to 2 1/2 miles LEVEL 7 Increase workout to 3 miles . LEVEL 8 Alternate between running /jogging every 0.25 miles Instructions . Mandatory 2 day rest between workouts for first two weeks. . Do not advance more than 2 levels per week.  . Two days rest mandatory between levels 1, 2, and 3 workouts. . One day rest mandatory between levels 4-8 workouts. Soreness Rules . If sore during warm-up, take 2 days off and drop down 1 level. . If sore during workout, take 1 day off and drop down 1 level. . If sore after workout, stay at same level.

## 2019-07-14 NOTE — Therapy (Signed)
[facility] [place] [address] [place], [place], [address] Phone: [phone]   Fax:  [phone]  Physical Therapy Treatment  Patient Details  Name: Peter Erickson MRN: [id number] Date of Birth: 08/22/1975 Referring Provider (PT): Jenita Seashore, MD   Encounter Date: 07/14/2019  PT End of Session - 07/14/19 1023    Visit Number  4    Number of Visits  8    Date for PT Re-Evaluation  08/12/19    Authorization Type  BCBS    PT Start Time  0926    PT Stop Time  1014    PT Time Calculation (min)  48 min    Activity Tolerance  Patient tolerated treatment well    Behavior During Therapy  [facility] for tasks assessed/performed       History reviewed. No pertinent past medical history.  History reviewed. No pertinent surgical history.  There were no vitals filed for this visit.  Subjective Assessment - 07/14/19 1019    Subjective  Pt reports L knee is feeling much better just a little sore from working out. Pt states that he was able to run one mile yesterday on a flat, paved surface outside with no increase in knee pain during or after.    Currently in Pain?  No/denies    Pain Score  0-No pain    Pain Location  Knee                       [facility] Adult PT Treatment/Exercise - 07/14/19 0001      Knee/Hip Exercises: Stretches   Gastroc Stretch  30 seconds;Both    Gastroc Stretch Limitations  standing      Knee/Hip Exercises: Machines for Strengthening   Cybex Leg Press  60# BLE 2x10, 40# SLE 2x10 bilat      Knee/Hip Exercises: Standing   Heel Raises  Both;1 set;15 reps    Heel Raises Limitations  60#    Step Down  Both;2 sets;10 reps;Step Height: 6"    Step Down Limitations  cues for eccentric control on L. Height changed from 8" to 6" d/t pt report of L knee pain    Functional Squat  2 sets;10 reps    Functional Squat Limitations  SLS to chair with blue mat to raise height d/t pt knee pain    Walking with  Sports Cord  resisted walking 50# sidestepping B x3    Other Standing Knee Exercises  bosu ball squat x10 with cues to limit depth d/t L knee pain   added blue TB above knees   Other Standing Knee Exercises  SLS on dyna disc x30 sec B      Knee/Hip Exercises: Seated   Stool Scoot - Round Trips  2 laps around clinic             PT Education - 07/14/19 1022    Education Details  See pt instructions for added stretches to flexibility HEP and running progression protocol.    Person(s) Educated  Patient    Methods  Explanation    Comprehension  Verbalized understanding       PT Short Term Goals - 07/01/19 1713      PT SHORT TERM GOAL #1   Title  Pt will demonstrate ability to perform bridge, maintaining posterior pelvic tilt without pain.    Baseline  difficulty achieving posterior tilt in H/L with back imprinted, painful into low bridge  Time  2    Period  Weeks    Status  New    Target Date  07/15/19      PT SHORT TERM GOAL #2   Title  Pt will increase L hip abduction strength to 4+/5 for improved HKA alignment.    Baseline  3+/5    Time  4    Status  New    Target Date  07/29/19      PT SHORT TERM GOAL #3   Title  Pt will increase hip extension strength to 4+/5 B.    Baseline  3+/5 L, 4-/5 R    Time  4    Period  Weeks    Status  New    Target Date  07/29/19        PT Long Term Goals - 07/01/19 1715      PT LONG TERM GOAL #1   Title  Pt will be able to return to running without pain, understanding how to adapt load progressively over time for injury prevention.    Baseline  Pain post run    Time  6    Period  Weeks    Status  New    Target Date  08/12/19      PT LONG TERM GOAL #2   Title  Pt will demonstrate 10 single leg squats B with proper form and mechanics, pain free.    Time  6    Period  Weeks    Status  New    Target Date  08/12/19      PT LONG TERM GOAL #3   Title  Pt will have no knee pain negotiating stairs for home and community  access.    Baseline  Pain    Time  6    Period  Weeks    Status  New    Target Date  08/12/19            Plan - 07/14/19 1024    Clinical Impression Statement  Pt progressing well with TE; improved performance with SLS exercises. Pt still experiencing L knee pain when attempting to increase depth of single limb squats. Revised pt HEP to include daily flexibility ex's focused on dynamic and static stretching of quads, hamstrings, IT band, and gastroc. See pt instructions for return to running protocol; working towards progressing pt mileage from 1 mile to 2 miles.    PT Treatment/Interventions  Cryotherapy;Electrical Stimulation;Iontophoresis 4mg /ml Dexamethasone;Moist Heat;Stair training;Gait training;Functional mobility training;Therapeutic activities;Neuromuscular re-education;Balance training;Therapeutic exercise;Patient/family education;Dry needling;Passive range of motion;Manual techniques;Joint Manipulations;Vasopneumatic Device    PT Next Visit Plan  Assess HEP, continue to progress static glute and core strength, myofascial restrictions to quads. Initiate running training/analysis in clinic as indicated.    PT Home Exercise Plan  [id number] - dynamic warm up for running: plank against wall high knees x 30 seconds, plank against wall and IR/ER hip x 20 B, hip swings AP/ML x 20 each, PPT 1 x 15-20 x 5 seconds       Patient will benefit from skilled therapeutic intervention in order to improve the following deficits and impairments:  Hypermobility, Decreased strength, Increased fascial restricitons, Impaired flexibility, Postural dysfunction, Pain, Improper body mechanics, Impaired perceived functional ability, Decreased activity tolerance  Visit Diagnosis: Left knee pain, unspecified chronicity  Right knee pain, unspecified chronicity  Muscle weakness (generalized)  Posture abnormality     Problem List Patient Active Problem List   Diagnosis Date Noted  . Prepatellar  bursitis, left  knee 06/13/2019  . Patellofemoral pain syndrome of right knee 06/13/2019  . Patellofemoral pain syndrome of left knee 04/21/2019   Lysle Rubens, PT, DPT Maryanna Shape Vanessia Bokhari 07/14/2019, 10:28 AM  [facility] [place] [facility] [address] [place], [place], [address] Phone: [phone]   Fax:  [phone]  Name: Peter Erickson MRN: [id number] Date of Birth: 1975-08-27

## 2019-07-16 ENCOUNTER — Encounter: Payer: Self-pay | Admitting: Physical Therapy

## 2019-07-16 ENCOUNTER — Other Ambulatory Visit: Payer: Self-pay

## 2019-07-16 ENCOUNTER — Ambulatory Visit: Payer: BC Managed Care – PPO | Admitting: Physical Therapy

## 2019-07-16 DIAGNOSIS — R293 Abnormal posture: Secondary | ICD-10-CM | POA: Diagnosis not present

## 2019-07-16 DIAGNOSIS — M25561 Pain in right knee: Secondary | ICD-10-CM | POA: Diagnosis not present

## 2019-07-16 DIAGNOSIS — M6281 Muscle weakness (generalized): Secondary | ICD-10-CM | POA: Diagnosis not present

## 2019-07-16 DIAGNOSIS — M25562 Pain in left knee: Secondary | ICD-10-CM

## 2019-07-16 NOTE — Therapy (Signed)
[facility] [place] [address] [place], [place], [address] Phone: [phone]   Fax:  [phone]  Physical Therapy Treatment  Patient Details  Name: Peter Erickson MRN: [id number] Date of Birth: 1975/10/26 Referring Provider (PT): Jenita Seashore, MD   Encounter Date: 07/16/2019  PT End of Session - 07/16/19 1402    Visit Number  5    Number of Visits  8    Date for PT Re-Evaluation  08/12/19    Authorization Type  BCBS    PT Start Time  1315    PT Stop Time  1400    PT Time Calculation (min)  45 min    Activity Tolerance  Patient tolerated treatment well    Behavior During Therapy  [facility] for tasks assessed/performed       History reviewed. No pertinent past medical history.  History reviewed. No pertinent surgical history.  There were no vitals filed for this visit.      [facility] PT Assessment - 07/16/19 0001      Functional Tests   Functional tests  Running      Running   Comments  Pt completed 1 mile with 3 minute warmup followed by alternating 1 minute walking, 5 minutes running                   [facility] Adult PT Treatment/Exercise - 07/16/19 0001      Knee/Hip Exercises: Stretches   Gastroc Stretch  30 seconds;Both    Gastroc Stretch Limitations  standing    Soleus Stretch  Both;30 seconds    Soleus Stretch Limitations  standing      Knee/Hip Exercises: Standing   Hip Abduction  Both;2 sets;10 reps   10# on multiuse machine   Hip Extension  Both;2 sets;10 reps   10# 2 sets on multiuse machine   Step Down  Both;2 sets;10 reps;Step Height: 6"    Step Down Limitations  cues for eccentric control on R; pt reports some tightness on outside of R knee    Functional Squat  2 sets;10 reps    Functional Squat Limitations  SLS to chair with one pillow to raise height d/t pt complaints of knee pain    Lunge Walking - Round Trips  down and back to door x2               PT Short Term Goals -  07/01/19 1713      PT SHORT TERM GOAL #1   Title  Pt will demonstrate ability to perform bridge, maintaining posterior pelvic tilt without pain.    Baseline  difficulty achieving posterior tilt in H/L with back imprinted, painful into low bridge    Time  2    Period  Weeks    Status  New    Target Date  07/15/19      PT SHORT TERM GOAL #2   Title  Pt will increase L hip abduction strength to 4+/5 for improved HKA alignment.    Baseline  3+/5    Time  4    Status  New    Target Date  07/29/19      PT SHORT TERM GOAL #3   Title  Pt will increase hip extension strength to 4+/5 B.    Baseline  3+/5 L, 4-/5 R    Time  4    Period  Weeks    Status  New    Target Date  07/29/19  PT Long Term Goals - 07/01/19 1715      PT LONG TERM GOAL #1   Title  Pt will be able to return to running without pain, understanding how to adapt load progressively over time for injury prevention.    Baseline  Pain post run    Time  6    Period  Weeks    Status  New    Target Date  08/12/19      PT LONG TERM GOAL #2   Title  Pt will demonstrate 10 single leg squats B with proper form and mechanics, pain free.    Time  6    Period  Weeks    Status  New    Target Date  08/12/19      PT LONG TERM GOAL #3   Title  Pt will have no knee pain negotiating stairs for home and community access.    Baseline  Pain    Time  6    Period  Weeks    Status  New    Target Date  08/12/19            Plan - 07/16/19 1403    Clinical Impression Statement  Pt completed 1 mile of running in clinic today at speed of 5.0 mph alternating running/walking with no reports of increase knee pain. Pt demonstrated decreased trunk rotation on L and overpronation during stance phase bilaterally. Educated pt on inserts to correct overpronation and advised to trial one with running and see if it helps. Pt single leg squat is progressing; pt still reports B knee pain with deep squats. Pt advised to continue return to  running protocol; follow up on progress next rx. Continue LE strengthening/flexibility ex's.    PT Treatment/Interventions  Cryotherapy;Electrical Stimulation;Iontophoresis 4mg /ml Dexamethasone;Moist Heat;Stair training;Gait training;Functional mobility training;Therapeutic activities;Neuromuscular re-education;Balance training;Therapeutic exercise;Patient/family education;Dry needling;Passive range of motion;Manual techniques;Joint Manipulations;Vasopneumatic Device    PT Next Visit Plan  Progress LE strengthening/flexibility static and dynamic ex's, manual as indicated, return to running training/analysis as indicated.    PT Home Exercise Plan  [id number] - dynamic warm up for running: plank against wall high knees x 30 seconds, plank against wall and IR/ER hip x 20 B, hip swings AP/ML x 20 each, PPT 1 x 15-20 x 5 seconds    Consulted and Agree with Plan of Care  Patient       Patient will benefit from skilled therapeutic intervention in order to improve the following deficits and impairments:  Hypermobility, Decreased strength, Increased fascial restricitons, Impaired flexibility, Postural dysfunction, Pain, Improper body mechanics, Impaired perceived functional ability, Decreased activity tolerance  Visit Diagnosis: Left knee pain, unspecified chronicity  Right knee pain, unspecified chronicity  Muscle weakness (generalized)  Posture abnormality     Problem List Patient Active Problem List   Diagnosis Date Noted  . Prepatellar bursitis, left knee 06/13/2019  . Patellofemoral pain syndrome of right knee 06/13/2019  . Patellofemoral pain syndrome of left knee 04/21/2019   Amador Cunas, PT, DPT Donald Prose Elysabeth Aust 07/16/2019, 2:06 PM  [place], [place], [address] Phone: [phone]   Fax:  [phone]  Name: Peter Erickson MRN: [id number] Date of Birth: 28-Jan-1976

## 2019-07-22 ENCOUNTER — Other Ambulatory Visit: Payer: Self-pay

## 2019-07-22 ENCOUNTER — Ambulatory Visit: Payer: BC Managed Care – PPO | Attending: Family Medicine | Admitting: Physical Therapy

## 2019-07-22 DIAGNOSIS — R293 Abnormal posture: Secondary | ICD-10-CM | POA: Diagnosis not present

## 2019-07-22 DIAGNOSIS — M7042 Prepatellar bursitis, left knee: Secondary | ICD-10-CM | POA: Diagnosis not present

## 2019-07-22 DIAGNOSIS — M6281 Muscle weakness (generalized): Secondary | ICD-10-CM | POA: Diagnosis not present

## 2019-07-22 DIAGNOSIS — M222X1 Patellofemoral disorders, right knee: Secondary | ICD-10-CM | POA: Insufficient documentation

## 2019-07-22 DIAGNOSIS — M25562 Pain in left knee: Secondary | ICD-10-CM | POA: Insufficient documentation

## 2019-07-22 DIAGNOSIS — M25561 Pain in right knee: Secondary | ICD-10-CM | POA: Diagnosis not present

## 2019-07-22 DIAGNOSIS — M222X2 Patellofemoral disorders, left knee: Secondary | ICD-10-CM | POA: Diagnosis not present

## 2019-07-22 NOTE — Therapy (Signed)
[facility] [place] [address] [place], [place], [address] Phone: [phone]   Fax:  [phone]  Physical Therapy Treatment  Patient Details  Name: Avion Kutzer MRN: [id number] Date of Birth: 05-22-1975 Referring Provider (PT): Jenita Seashore, MD   Encounter Date: 07/22/2019  PT End of Session - 07/22/19 1359    Visit Number  6    Number of Visits  8    PT Start Time  1303    PT Stop Time  1355    PT Time Calculation (min)  52 min    Activity Tolerance  Patient tolerated treatment well    Behavior During Therapy  [facility] for tasks assessed/performed       No past medical history on file.  No past surgical history on file.  There were no vitals filed for this visit.  Subjective Assessment - 07/22/19 1309    Subjective  Pt reports soreness in both knees from SLS he did last night, he stated that he was able to run 2 miles overe the week end with no pain the next day    Currently in Pain?  No/denies    Pain Score  0-No pain                       [facility] Adult PT Treatment/Exercise - 07/22/19 0001      Knee/Hip Exercises: Aerobic   Tread Mill  15 mins, 5 min warm up      Knee/Hip Exercises: Machines for Strengthening   Total Gym Leg Press  60#, 2x10      Knee/Hip Exercises: Standing   Hip Abduction  Both;2 sets;10 reps   15#   Hip Extension  Both;2 sets;10 reps   15#   Step Down  Both;2 sets;10 reps;Step Height: 6"    Step Down Limitations  first set 6', 2nd 8'    Functional Squat  2 sets;10 reps    Functional Squat Limitations  SLS to chair with one pillow to raise height d/t pt complaints of knee pain    Other Standing Knee Exercises  wall sit, with ball 5x 15 sec    Other Standing Knee Exercises  walking lounges 3x, 6#               PT Short Term Goals - 07/01/19 1713      PT SHORT TERM GOAL #1   Title  Pt will demonstrate ability to perform bridge, maintaining posterior pelvic tilt  without pain.    Baseline  difficulty achieving posterior tilt in H/L with back imprinted, painful into low bridge    Time  2    Period  Weeks    Status  New    Target Date  07/15/19      PT SHORT TERM GOAL #2   Title  Pt will increase L hip abduction strength to 4+/5 for improved HKA alignment.    Baseline  3+/5    Time  4    Status  New    Target Date  07/29/19      PT SHORT TERM GOAL #3   Title  Pt will increase hip extension strength to 4+/5 B.    Baseline  3+/5 L, 4-/5 R    Time  4    Period  Weeks    Status  New    Target Date  07/29/19        PT Long Term Goals - 07/01/19  [place] #1   Title  Pt will be able to return to running without pain, understanding how to adapt load progressively over time for injury prevention.    Baseline  Pain post run    Time  6    Period  Weeks    Status  New    Target Date  08/12/19      PT LONG TERM GOAL #2   Title  Pt will demonstrate 10 single leg squats B with proper form and mechanics, pain free.    Time  6    Period  Weeks    Status  New    Target Date  08/12/19      PT LONG TERM GOAL #3   Title  Pt will have no knee pain negotiating stairs for home and community access.    Baseline  Pain    Time  6    Period  Weeks    Status  New    Target Date  08/12/19            Plan - 07/22/19 1400    Clinical Impression Statement  Pt performed B deep squats with minimal pain, he was able to complete step downs on the 8' board pretty well, he does good with SLS but they are still difficult onto the chair w/o the airex, he was unable to add weight to B squats d/t ant knee pain, pt was reminded to continue running program.       Patient will benefit from skilled therapeutic intervention in order to improve the following deficits and impairments:     Visit Diagnosis: Left knee pain, unspecified chronicity  Right knee pain, unspecified chronicity  Muscle weakness (generalized)  Posture  abnormality     Problem List Patient Active Problem List   Diagnosis Date Noted  . Prepatellar bursitis, left knee 06/13/2019  . Patellofemoral pain syndrome of right knee 06/13/2019  . Patellofemoral pain syndrome of left knee 04/21/2019    Clarene Essex, SPTA 07/22/2019, 2:15 PM  [place], [place], [address] Phone: [phone]   Fax:  [phone]  Name: Khylan Sawyer MRN: [id number] Date of Birth: March 04, 1976

## 2019-07-25 ENCOUNTER — Ambulatory Visit: Payer: BC Managed Care – PPO | Admitting: Physical Therapy

## 2019-07-25 ENCOUNTER — Other Ambulatory Visit: Payer: Self-pay

## 2019-07-25 DIAGNOSIS — M222X1 Patellofemoral disorders, right knee: Secondary | ICD-10-CM | POA: Diagnosis not present

## 2019-07-25 DIAGNOSIS — M7042 Prepatellar bursitis, left knee: Secondary | ICD-10-CM | POA: Diagnosis not present

## 2019-07-25 DIAGNOSIS — M25561 Pain in right knee: Secondary | ICD-10-CM

## 2019-07-25 DIAGNOSIS — R293 Abnormal posture: Secondary | ICD-10-CM | POA: Diagnosis not present

## 2019-07-25 DIAGNOSIS — M222X2 Patellofemoral disorders, left knee: Secondary | ICD-10-CM | POA: Diagnosis not present

## 2019-07-25 DIAGNOSIS — M25562 Pain in left knee: Secondary | ICD-10-CM

## 2019-07-25 DIAGNOSIS — M6281 Muscle weakness (generalized): Secondary | ICD-10-CM

## 2019-07-25 NOTE — Therapy (Signed)
[facility] [place] [facility] [address] [place], [place], [address] Phone: [phone]   Fax:  [phone]  Physical Therapy Treatment  Patient Details  Name: Peter Erickson MRN: [id number] Date of Birth: July 16, 1975 Referring Provider (PT): Jenita Seashore, MD   Encounter Date: 07/25/2019  PT End of Session - 07/25/19 1141    Visit Number  7    Date for PT Re-Evaluation  08/12/19    PT Start Time  1102    PT Stop Time  1141    PT Time Calculation (min)  39 min    Activity Tolerance  Patient tolerated treatment well    Behavior During Therapy  [facility] for tasks assessed/performed       No past medical history on file.  No past surgical history on file.  There were no vitals filed for this visit.  Subjective Assessment - 07/25/19 1105    Subjective  Pt reports that his knees are feeling good today, he is a bit sore from yoga he did yesterday    Currently in Pain?  No/denies    Pain Score  0-No pain                       [facility] Adult PT Treatment/Exercise - 07/25/19 0001      Knee/Hip Exercises: Aerobic   Stationary Bike  seat 5, 6 min      Knee/Hip Exercises: Machines for Strengthening   Total Gym Leg Press  60#, 2x10   70# 2nd set   Other Machine  resisted gait, 25#, fwd back sidetep 5x      Knee/Hip Exercises: Standing   Forward Lunges  Both;2 sets;10 reps   6# onto bosu   Step Down  Both;2 sets;10 reps   8'   Functional Squat  2 sets;10 reps   on upside down bosu   Functional Squat Limitations  SLS to chair    Other Standing Knee Exercises  --               PT Short Term Goals - 07/01/19 1713      PT SHORT TERM GOAL #1   Title  Pt will demonstrate ability to perform bridge, maintaining posterior pelvic tilt without pain.    Baseline  difficulty achieving posterior tilt in H/L with back imprinted, painful into low bridge    Time  2    Period  Weeks    Status  New    Target Date  07/15/19       PT SHORT TERM GOAL #2   Title  Pt will increase L hip abduction strength to 4+/5 for improved HKA alignment.    Baseline  3+/5    Time  4    Status  New    Target Date  07/29/19      PT SHORT TERM GOAL #3   Title  Pt will increase hip extension strength to 4+/5 B.    Baseline  3+/5 L, 4-/5 R    Time  4    Period  Weeks    Status  New    Target Date  07/29/19        PT Long Term Goals - 07/25/19 1153      PT LONG TERM GOAL #2   Title  Pt will demonstrate 10 single leg squats B with proper form and mechanics, pain free.    Status  Achieved      PT LONG  TERM GOAL #3   Title  Pt will have no knee pain negotiating stairs for home and community access.    Status  Achieved            Plan - 07/25/19 1150    Clinical Impression Statement  Pt was able to perform SLS onto the blue chair with no pain, some diffuclty taking longer steps during the resisted gait exercises, he tolerated the squats on bosu well although he stated it was difficult, more difficulty doing step downs with the L LE than the R.    PT Treatment/Interventions  Cryotherapy;Electrical Stimulation;Iontophoresis 4mg /ml Dexamethasone;Moist Heat;Stair training;Gait training;Functional mobility training;Therapeutic activities;Neuromuscular re-education;Balance training;Therapeutic exercise;Patient/family education;Dry needling;Passive range of motion;Manual techniques;Joint Manipulations;Vasopneumatic Device    PT Next Visit Plan  Progress LE strengthening/flexibility static and dynamic ex's, manual as indicated, return to running training/analysis as indicated.       Patient will benefit from skilled therapeutic intervention in order to improve the following deficits and impairments:  Hypermobility, Decreased strength, Increased fascial restricitons, Impaired flexibility, Postural dysfunction, Pain, Improper body mechanics, Impaired perceived functional ability, Decreased activity tolerance  Visit  Diagnosis: Left knee pain, unspecified chronicity  Right knee pain, unspecified chronicity  Muscle weakness (generalized)  Posture abnormality     Problem List Patient Active Problem List   Diagnosis Date Noted  . Prepatellar bursitis, left knee 06/13/2019  . Patellofemoral pain syndrome of right knee 06/13/2019  . Patellofemoral pain syndrome of left knee 04/21/2019    Clarene Essex, SPTA 07/25/2019, 11:55 AM  [place], [place], [address] Phone: [phone]   Fax:  [phone]  Name: Peter Erickson MRN: [id number] Date of Birth: Jul 05, 1975

## 2019-07-29 ENCOUNTER — Ambulatory Visit: Payer: BC Managed Care – PPO | Admitting: Physical Therapy

## 2019-07-29 ENCOUNTER — Other Ambulatory Visit: Payer: Self-pay

## 2019-07-29 ENCOUNTER — Encounter: Payer: Self-pay | Admitting: Physical Therapy

## 2019-07-29 DIAGNOSIS — M25561 Pain in right knee: Secondary | ICD-10-CM | POA: Diagnosis not present

## 2019-07-29 DIAGNOSIS — M222X1 Patellofemoral disorders, right knee: Secondary | ICD-10-CM | POA: Diagnosis not present

## 2019-07-29 DIAGNOSIS — M25562 Pain in left knee: Secondary | ICD-10-CM

## 2019-07-29 DIAGNOSIS — M7042 Prepatellar bursitis, left knee: Secondary | ICD-10-CM | POA: Diagnosis not present

## 2019-07-29 DIAGNOSIS — M222X2 Patellofemoral disorders, left knee: Secondary | ICD-10-CM | POA: Diagnosis not present

## 2019-07-29 DIAGNOSIS — M6281 Muscle weakness (generalized): Secondary | ICD-10-CM | POA: Diagnosis not present

## 2019-07-29 DIAGNOSIS — R293 Abnormal posture: Secondary | ICD-10-CM

## 2019-07-29 NOTE — Therapy (Signed)
[facility] [place] [facility] [address] [place], [place], [address] Phone: [phone]   Fax:  [phone]  Physical Therapy Treatment  Patient Details  Name: Peter Erickson MRN: [id number] Date of Birth: [birth year] Referring Provider (PT): Jenita Seashore, MD   Encounter Date: 07/29/2019  PT End of Session - 07/29/19 0927    Visit Number  8    Date for PT Re-Evaluation  08/12/19    PT Start Time  0845    PT Stop Time  0930    PT Time Calculation (min)  45 min    Activity Tolerance  Patient tolerated treatment well    Behavior During Therapy  [facility] for tasks assessed/performed       History reviewed. No pertinent past medical history.  History reviewed. No pertinent surgical history.  There were no vitals filed for this visit.  Subjective Assessment - 07/29/19 0852    Subjective  Pt reports that he went for a 2 mile run on Sunday and knees were a little sore after but no pain.    Currently in Pain?  No/denies    Pain Score  0-No pain                       [facility] Adult PT Treatment/Exercise - 07/29/19 0001      Knee/Hip Exercises: Stretches   Gastroc Stretch  Both;2 reps;60 seconds      Knee/Hip Exercises: Aerobic   Tread Mill  15 min, 5 min warmup. 5.0 speed; 2.0 incline      Knee/Hip Exercises: Machines for Strengthening   Cybex Knee Extension  25# 2x10    Cybex Knee Flexion  35# 2x10    Total Gym Leg Press  70# 2x10 BLE, 40# 1x10 LLE/RLE    Other Machine  resisted gait, 35# B, fwd back sidetep 5x      Knee/Hip Exercises: Plyometrics   Bilateral Jumping  2 sets;10 reps;Box Height: 6"      Knee/Hip Exercises: Standing   Heel Raises  Both;1 set;15 reps    SLS  2x10 to chair; 2nd set with blue mat d/t L knee pain    SLS with Vectors  2x10 with 9# dumbbell lean forward               PT Short Term Goals - 07/01/19 1713      PT SHORT TERM GOAL #1   Title  Pt will demonstrate ability to perform  bridge, maintaining posterior pelvic tilt without pain.    Baseline  difficulty achieving posterior tilt in H/L with back imprinted, painful into low bridge    Time  2    Period  Weeks    Status  New    Target Date  07/15/19      PT SHORT TERM GOAL #2   Title  Pt will increase L hip abduction strength to 4+/5 for improved HKA alignment.    Baseline  3+/5    Time  4    Status  New    Target Date  07/29/19      PT SHORT TERM GOAL #3   Title  Pt will increase hip extension strength to 4+/5 B.    Baseline  3+/5 L, 4-/5 R    Time  4    Period  Weeks    Status  New    Target Date  07/29/19        PT Long Term Goals -  07/25/19 1153      PT LONG TERM GOAL #2   Title  Pt will demonstrate 10 single leg squats B with proper form and mechanics, pain free.    Status  Achieved      PT LONG TERM GOAL #3   Title  Pt will have no knee pain negotiating stairs for home and community access.    Status  Achieved            Plan - 07/29/19 0928    Clinical Impression Statement  Pt is improving with both static and dynamic stability of the knees; still complains of pain with increasing depth of exercise esp in L knee. Continue to progress. Pt encouraged to progress running from 1x/week to 2x/week with instructions to report back knee pain response.    PT Treatment/Interventions  Cryotherapy;Electrical Stimulation;Iontophoresis 4mg /ml Dexamethasone;Moist Heat;Stair training;Gait training;Functional mobility training;Therapeutic activities;Neuromuscular re-education;Balance training;Therapeutic exercise;Patient/family education;Dry needling;Passive range of motion;Manual techniques;Joint Manipulations;Vasopneumatic Device    PT Next Visit Plan  Progress LE strengthening/flexibility static and dynamic ex's, manual as indicated, return to running training/analysis as indicated.       Patient will benefit from skilled therapeutic intervention in order to improve the following deficits and  impairments:  Hypermobility, Decreased strength, Increased fascial restricitons, Impaired flexibility, Postural dysfunction, Pain, Improper body mechanics, Impaired perceived functional ability, Decreased activity tolerance  Visit Diagnosis: Left knee pain, unspecified chronicity  Right knee pain, unspecified chronicity  Muscle weakness (generalized)  Posture abnormality     Problem List Patient Active Problem List   Diagnosis Date Noted  . Prepatellar bursitis, left knee 06/13/2019  . Patellofemoral pain syndrome of right knee 06/13/2019  . Patellofemoral pain syndrome of left knee 04/21/2019   Amador Cunas, PT, DPT Donald Prose Nolton Denis 07/29/2019, 9:32 AM  [place], [place], [address] Phone: [phone]   Fax:  [phone]  Name: Peter Erickson MRN: [id number] Date of Birth: 08-Sep-1975

## 2019-08-01 ENCOUNTER — Ambulatory Visit: Payer: BC Managed Care – PPO | Admitting: Physical Therapy

## 2019-08-01 ENCOUNTER — Encounter: Payer: Self-pay | Admitting: Physical Therapy

## 2019-08-01 ENCOUNTER — Other Ambulatory Visit: Payer: Self-pay

## 2019-08-01 DIAGNOSIS — R293 Abnormal posture: Secondary | ICD-10-CM

## 2019-08-01 DIAGNOSIS — M222X2 Patellofemoral disorders, left knee: Secondary | ICD-10-CM | POA: Diagnosis not present

## 2019-08-01 DIAGNOSIS — M6281 Muscle weakness (generalized): Secondary | ICD-10-CM

## 2019-08-01 DIAGNOSIS — M25562 Pain in left knee: Secondary | ICD-10-CM

## 2019-08-01 DIAGNOSIS — M7042 Prepatellar bursitis, left knee: Secondary | ICD-10-CM | POA: Diagnosis not present

## 2019-08-01 DIAGNOSIS — M25561 Pain in right knee: Secondary | ICD-10-CM

## 2019-08-01 DIAGNOSIS — M222X1 Patellofemoral disorders, right knee: Secondary | ICD-10-CM | POA: Diagnosis not present

## 2019-08-01 NOTE — Therapy (Signed)
[facility] [place] [facility] [address] [place], [place], [address] Phone: [phone]   Fax:  [phone]  Physical Therapy Treatment  Patient Details  Name: Peter Erickson MRN: [id number] Date of Birth: [birth year] Referring Provider (PT): Jenita Seashore, MD   Encounter Date: 08/01/2019  PT End of Session - 08/01/19 0929    Visit Number  9    Date for PT Re-Evaluation  08/12/19    Authorization Type  BCBS    PT Start Time  0845    PT Stop Time  0930    PT Time Calculation (min)  45 min    Activity Tolerance  Patient tolerated treatment well    Behavior During Therapy  [facility] for tasks assessed/performed       History reviewed. No pertinent past medical history.  History reviewed. No pertinent surgical history.  There were no vitals filed for this visit.  Subjective Assessment - 08/01/19 0851    Subjective  Pt reports that his knees, esp R knee, were very sore after last rx. Pt states he is feeling better today but didnt feel comfortable running yesterday.    Pain Score  1     Pain Location  Knee    Pain Orientation  Right                        [facility] Adult PT Treatment/Exercise - 08/01/19 0001      Knee/Hip Exercises: Stretches   Gastroc Stretch  Both;2 reps;60 seconds    Soleus Stretch  Both;30 seconds      Knee/Hip Exercises: Machines for Strengthening   Cybex Knee Extension  25# 2x10    Cybex Knee Flexion  45# 2x10    Total Gym Leg Press  70# 2x10 BLE, 40# 1x10 LLE/RLE, 40# heel raise 2x15      Knee/Hip Exercises: Standing   Step Down  Both;2 sets;10 reps;Step Height: 8"    Functional Squat  2 sets;10 reps    Functional Squat Limitations  SLS to chair; foam pad underneath d/t pt reports of pain    Other Standing Knee Exercises  single leg RDL 16# 1x10 B               PT Short Term Goals - 07/01/19 1713      PT SHORT TERM GOAL #1   Title  Pt will demonstrate ability to perform bridge,  maintaining posterior pelvic tilt without pain.    Baseline  difficulty achieving posterior tilt in H/L with back imprinted, painful into low bridge    Time  2    Period  Weeks    Status  New    Target Date  07/15/19      PT SHORT TERM GOAL #2   Title  Pt will increase L hip abduction strength to 4+/5 for improved HKA alignment.    Baseline  3+/5    Time  4    Status  New    Target Date  07/29/19      PT SHORT TERM GOAL #3   Title  Pt will increase hip extension strength to 4+/5 B.    Baseline  3+/5 L, 4-/5 R    Time  4    Period  Weeks    Status  New    Target Date  07/29/19        PT Long Term Goals - 07/25/19 1153      PT LONG TERM  GOAL #2   Title  Pt will demonstrate 10 single leg squats B with proper form and mechanics, pain free.    Status  Achieved      PT LONG TERM GOAL #3   Title  Pt will have no knee pain negotiating stairs for home and community access.    Status  Achieved            Plan - 08/01/19 0929    Clinical Impression Statement  Last rx attempted to progress pt to plyometric/dynamic ex's of the knee; pt experienced increase in R knee pain. Progressed static stability ex's and machine controlled interventions; pt tolerated well. Attempt to progress dynamic stability/ex's next rx.    PT Treatment/Interventions  Cryotherapy;Electrical Stimulation;Iontophoresis 4mg /ml Dexamethasone;Moist Heat;Stair training;Gait training;Functional mobility training;Therapeutic activities;Neuromuscular re-education;Balance training;Therapeutic exercise;Patient/family education;Dry needling;Passive range of motion;Manual techniques;Joint Manipulations;Vasopneumatic Device    PT Next Visit Plan  Progress LE strengthening/flexibility static and dynamic ex's, manual as indicated, return to running training/analysis as indicated.    Consulted and Agree with Plan of Care  Patient       Patient will benefit from skilled therapeutic intervention in order to improve the  following deficits and impairments:  Hypermobility, Decreased strength, Increased fascial restricitons, Impaired flexibility, Postural dysfunction, Pain, Improper body mechanics, Impaired perceived functional ability, Decreased activity tolerance  Visit Diagnosis: Left knee pain, unspecified chronicity  Right knee pain, unspecified chronicity  Muscle weakness (generalized)  Posture abnormality     Problem List Patient Active Problem List   Diagnosis Date Noted  . Prepatellar bursitis, left knee 06/13/2019  . Patellofemoral pain syndrome of right knee 06/13/2019  . Patellofemoral pain syndrome of left knee 04/21/2019   Amador Cunas, PT, DPT Donald Prose Sugg 08/01/2019, 9:31 AM  [place], [place], [address] Phone: [phone]   Fax:  [phone]  Name: Peter Erickson MRN: [id number] Date of Birth: January 30, 1976

## 2019-08-05 ENCOUNTER — Encounter: Payer: Self-pay | Admitting: Physical Therapy

## 2019-08-05 ENCOUNTER — Ambulatory Visit: Payer: BC Managed Care – PPO | Admitting: Physical Therapy

## 2019-08-05 ENCOUNTER — Other Ambulatory Visit: Payer: Self-pay

## 2019-08-05 DIAGNOSIS — M222X2 Patellofemoral disorders, left knee: Secondary | ICD-10-CM

## 2019-08-05 DIAGNOSIS — R293 Abnormal posture: Secondary | ICD-10-CM | POA: Diagnosis not present

## 2019-08-05 DIAGNOSIS — M25562 Pain in left knee: Secondary | ICD-10-CM | POA: Diagnosis not present

## 2019-08-05 DIAGNOSIS — M6281 Muscle weakness (generalized): Secondary | ICD-10-CM | POA: Diagnosis not present

## 2019-08-05 DIAGNOSIS — M222X1 Patellofemoral disorders, right knee: Secondary | ICD-10-CM

## 2019-08-05 DIAGNOSIS — M25561 Pain in right knee: Secondary | ICD-10-CM | POA: Diagnosis not present

## 2019-08-05 DIAGNOSIS — M7042 Prepatellar bursitis, left knee: Secondary | ICD-10-CM | POA: Diagnosis not present

## 2019-08-05 NOTE — Therapy (Signed)
[place], [place], [address] Phone: [phone]   Fax:  [phone]  Physical Therapy Treatment Progress Note Reporting Period 07/01/2019 to 08/05/2019  See note below for Objective Data and Assessment of Progress/Goals.    Patient Details  Name: Peter Erickson MRN: [id number] Date of Birth: 1976/02/16 Referring Provider (PT): Scarlette Ar, MD   Encounter Date: 08/05/2019  PT End of Session - 08/05/19 0940    Visit Number  10    Date for PT Re-Evaluation  08/12/19    Authorization Type  BCBS    PT Start Time  0845    PT Stop Time  0930    PT Time Calculation (min)  45 min    Activity Tolerance  Patient tolerated treatment well    Behavior During Therapy  [facility] for tasks assessed/performed       History reviewed. No pertinent past medical history.  History reviewed. No pertinent surgical history.  There were no vitals filed for this visit.  Subjective Assessment - 08/05/19 0843    Subjective  Pt reports that he went on a run Sunday and knees felt fine on run but he was very sore after; ran 2.5 miles with some uphill terrain.    Currently in Pain?  No/denies    Pain Score  0-No pain    Pain Location  Knee         [facility] PT Assessment - 08/05/19 0001      Strength   Overall Strength Comments  hip abduction strength improved to 4/5                    [facility] Adult PT Treatment/Exercise - 08/05/19 0001      Knee/Hip Exercises: Stretches   Sports administrator  Both;60 seconds;1 rep      Knee/Hip Exercises: Aerobic   Elliptical  L5 4 min fwd/3 min bkwd      Knee/Hip Exercises: Machines for Strengthening   Cybex Knee Extension  35# 2x10    Cybex Knee Flexion  45# 2x10    Total Gym Leg Press  80# 2x10 BLE, 40# 2x10 LLE/RLE, 40# heel raises    Other Machine  resisted gait, 35# B, fwd back sidetep 5x      Knee/Hip Exercises: Standing   Heel Raises  Both;1 set;15 reps    Step  Down  Both;2 sets;10 reps;Step Height: 8"    SLS  2x10 to chair B    Other Standing Knee Exercises  split squats no weight x12 B    Other Standing Knee Exercises  single leg RDL 16# 1x10 B               PT Short Term Goals - 08/05/19 0946      PT SHORT TERM GOAL #1   Title  Pt will demonstrate ability to perform bridge, maintaining posterior pelvic tilt without pain.    Time  2    Period  Weeks    Status  Achieved    Target Date  07/15/19      PT SHORT TERM GOAL #2   Title  Pt will increase L hip abduction strength to 4+/5 for improved HKA alignment.    Baseline  4-/5    Time  4    Status  On-going    Target Date  07/29/19      PT SHORT TERM GOAL #3   Title  Pt will increase hip extension  strength to 4+/5 B.    Baseline  4/5 L, 4+/5 R    Time  4    Period  Weeks    Status  On-going    Target Date  07/29/19        PT Long Term Goals - 08/05/19 0953      PT LONG TERM GOAL #1   Title  Pt will be able to return to running without pain, understanding how to adapt load progressively over time for injury prevention.    Time  6    Period  Weeks    Status  Partially Met      PT LONG TERM GOAL #2   Title  Pt will demonstrate 10 single leg squats B with proper form and mechanics, pain free.    Baseline  not completely pain free    Time  6    Status  Partially Met      PT LONG TERM GOAL #3   Title  Pt will have no knee pain negotiating stairs for home and community access.    Time  6    Period  Weeks    Status  Achieved            Plan - 08/05/19 1368    Clinical Impression Statement  Pt tolerated progression of TE well; still struggling with plyometrics and hills with B knees. R knee demonstrates greater instability and weakness in single limb stance but improving. Pt making progress with goals. Continue to progress ex's and encourage to progress running per protocol.    PT Treatment/Interventions  Cryotherapy;Electrical Stimulation;Iontophoresis '4mg'$ /ml  Dexamethasone;Moist Heat;Stair training;Gait training;Functional mobility training;Therapeutic activities;Neuromuscular re-education;Balance training;Therapeutic exercise;Patient/family education;Dry needling;Passive range of motion;Manual techniques;Joint Manipulations;Vasopneumatic Device    PT Next Visit Plan  Progress LE strengthening/flexibility static and dynamic ex's, manual as indicated, return to running training/analysis as indicated.    Consulted and Agree with Plan of Care  Patient       Patient will benefit from skilled therapeutic intervention in order to improve the following deficits and impairments:  Hypermobility, Decreased strength, Increased fascial restricitons, Impaired flexibility, Postural dysfunction, Pain, Improper body mechanics, Impaired perceived functional ability, Decreased activity tolerance  Visit Diagnosis: Patellofemoral pain syndrome of left knee  Patellofemoral pain syndrome of right knee  Prepatellar bursitis, left knee     Problem List Patient Active Problem List   Diagnosis Date Noted  . Prepatellar bursitis, left knee 06/13/2019  . Patellofemoral pain syndrome of right knee 06/13/2019  . Patellofemoral pain syndrome of left knee 04/21/2019   Amador Cunas, PT, DPT Donald Prose Feras Gardella 08/05/2019, 9:57 AM  [place], [place], [address] Phone: [phone]   Fax:  [phone]  Name: Peter Erickson MRN: [id number] Date of Birth: 09-27-1975

## 2019-08-08 ENCOUNTER — Encounter: Payer: Self-pay | Admitting: Physical Therapy

## 2019-08-08 ENCOUNTER — Ambulatory Visit: Payer: BC Managed Care – PPO | Admitting: Physical Therapy

## 2019-08-08 ENCOUNTER — Other Ambulatory Visit: Payer: Self-pay

## 2019-08-08 DIAGNOSIS — M222X2 Patellofemoral disorders, left knee: Secondary | ICD-10-CM | POA: Diagnosis not present

## 2019-08-08 DIAGNOSIS — M222X1 Patellofemoral disorders, right knee: Secondary | ICD-10-CM | POA: Diagnosis not present

## 2019-08-08 DIAGNOSIS — M7042 Prepatellar bursitis, left knee: Secondary | ICD-10-CM

## 2019-08-08 DIAGNOSIS — M25561 Pain in right knee: Secondary | ICD-10-CM | POA: Diagnosis not present

## 2019-08-08 DIAGNOSIS — R293 Abnormal posture: Secondary | ICD-10-CM | POA: Diagnosis not present

## 2019-08-08 DIAGNOSIS — M25562 Pain in left knee: Secondary | ICD-10-CM

## 2019-08-08 DIAGNOSIS — M6281 Muscle weakness (generalized): Secondary | ICD-10-CM | POA: Diagnosis not present

## 2019-08-08 NOTE — Therapy (Signed)
[place], [place], [address] Phone: [phone]   Fax:  [phone]  Physical Therapy Treatment  Patient Details  Name: Peter Erickson MRN: [id number] Date of Birth: 1975-07-10 Referring Provider (PT): Scarlette Ar, MD   Encounter Date: 08/08/2019  PT End of Session - 08/08/19 0928    Visit Number  11    Date for PT Re-Evaluation  08/12/19    Authorization Type  BCBS    PT Start Time  0840    PT Stop Time  0930    PT Time Calculation (min)  50 min    Activity Tolerance  Patient tolerated treatment well    Behavior During Therapy  [facility] for tasks assessed/performed       History reviewed. No pertinent past medical history.  History reviewed. No pertinent surgical history.  There were no vitals filed for this visit.  Subjective Assessment - 08/08/19 0841    Subjective  Pt went for 2.5 mile run yesterday; knees felt a little stiff after but are good today.    Currently in Pain?  Yes    Pain Score  1     Pain Location  Knee    Pain Orientation  Right;Left                        [facility] Adult PT Treatment/Exercise - 08/08/19 0001      Knee/Hip Exercises: Stretches   Active Hamstring Stretch  Both;5 reps;10 seconds    Active Hamstring Stretch Limitations  standing dynamic stretch    Quad Stretch  Both;30 seconds    Quad Stretch Limitations  standing    Gastroc Stretch  Both;2 reps;60 seconds    Soleus Stretch  Both;30 seconds      Knee/Hip Exercises: Aerobic   Elliptical  I12 R5 4 min fwd/4 min bkwd      Knee/Hip Exercises: Machines for Strengthening   Cybex Knee Extension  35# 2x10    Cybex Knee Flexion  45# 2x10    Total Gym Leg Press  100# 2x10 BLE, 50# 2x10 LLE/RLE    Other Machine  resisted gait, 35# B, fwd back sidetep 5x      Knee/Hip Exercises: Plyometrics   Bilateral Jumping  2 sets;10 reps;Box Height: 6"    Other Plyometric Exercises  side stepping up to  6" box B x10      Knee/Hip Exercises: Standing   Heel Raises  Both;1 set;15 reps    Step Down  Both;2 sets;10 reps;Step Height: 6"    Functional Squat  2 sets;10 reps    Functional Squat Limitations  SLS to chair; foam pad underneath d/t pt reports of pain    Other Standing Knee Exercises  split squats no weight x12 B    Other Standing Knee Exercises  single leg RDL 16# 1x10 B               PT Short Term Goals - 08/05/19 0946      PT SHORT TERM GOAL #1   Title  Pt will demonstrate ability to perform bridge, maintaining posterior pelvic tilt without pain.    Time  2    Period  Weeks    Status  Achieved    Target Date  07/15/19      PT SHORT TERM GOAL #2   Title  Pt will increase L hip abduction strength to 4+/5 for improved HKA alignment.  Baseline  4-/5    Time  4    Status  On-going    Target Date  07/29/19      PT SHORT TERM GOAL #3   Title  Pt will increase hip extension strength to 4+/5 B.    Baseline  4/5 L, 4+/5 R    Time  4    Period  Weeks    Status  On-going    Target Date  07/29/19        PT Long Term Goals - 08/05/19 0953      PT LONG TERM GOAL #1   Title  Pt will be able to return to running without pain, understanding how to adapt load progressively over time for injury prevention.    Time  6    Period  Weeks    Status  Partially Met      PT LONG TERM GOAL #2   Title  Pt will demonstrate 10 single leg squats B with proper form and mechanics, pain free.    Baseline  not completely pain free    Time  6    Status  Partially Met      PT LONG TERM GOAL #3   Title  Pt will have no knee pain negotiating stairs for home and community access.    Time  6    Period  Weeks    Status  Achieved            Plan - 08/08/19 0525    Clinical Impression Statement  Pt is improving with plyometric ex's; still struggles with hills and experiences increased soreness in B knees. Pt is currently running up to 2.5 miles on level surfaces with soreness  that resolves within 24-48 hrs post run. Continue to progress static and dynamic strengthening.    PT Treatment/Interventions  Cryotherapy;Electrical Stimulation;Iontophoresis '4mg'$ /ml Dexamethasone;Moist Heat;Stair training;Gait training;Functional mobility training;Therapeutic activities;Neuromuscular re-education;Balance training;Therapeutic exercise;Patient/family education;Dry needling;Passive range of motion;Manual techniques;Joint Manipulations;Vasopneumatic Device    PT Next Visit Plan  Progress LE strengthening/flexibility static and dynamic ex's, manual as indicated, return to running training/analysis as indicated.    Consulted and Agree with Plan of Care  Patient       Patient will benefit from skilled therapeutic intervention in order to improve the following deficits and impairments:  Hypermobility, Decreased strength, Increased fascial restricitons, Impaired flexibility, Postural dysfunction, Pain, Improper body mechanics, Impaired perceived functional ability, Decreased activity tolerance  Visit Diagnosis: Patellofemoral pain syndrome of left knee  Patellofemoral pain syndrome of right knee  Prepatellar bursitis, left knee  Left knee pain, unspecified chronicity  Right knee pain, unspecified chronicity  Muscle weakness (generalized)     Problem List Patient Active Problem List   Diagnosis Date Noted  . Prepatellar bursitis, left knee 06/13/2019  . Patellofemoral pain syndrome of right knee 06/13/2019  . Patellofemoral pain syndrome of left knee 04/21/2019   Amador Cunas, PT, DPT Donald Prose Bryella Diviney 08/08/2019, 9:30 AM  [place], [place], [address] Phone: [phone]   Fax:  [phone]  Name: Peter Erickson MRN: [id number] Date of Birth: September 10, 1975

## 2019-08-12 ENCOUNTER — Ambulatory Visit: Payer: BC Managed Care – PPO | Admitting: Physical Therapy

## 2019-08-12 ENCOUNTER — Encounter: Payer: Self-pay | Admitting: Physical Therapy

## 2019-08-12 ENCOUNTER — Other Ambulatory Visit: Payer: Self-pay

## 2019-08-12 DIAGNOSIS — M222X2 Patellofemoral disorders, left knee: Secondary | ICD-10-CM

## 2019-08-12 DIAGNOSIS — M25561 Pain in right knee: Secondary | ICD-10-CM | POA: Diagnosis not present

## 2019-08-12 DIAGNOSIS — M25562 Pain in left knee: Secondary | ICD-10-CM

## 2019-08-12 DIAGNOSIS — R293 Abnormal posture: Secondary | ICD-10-CM | POA: Diagnosis not present

## 2019-08-12 DIAGNOSIS — M7042 Prepatellar bursitis, left knee: Secondary | ICD-10-CM | POA: Diagnosis not present

## 2019-08-12 DIAGNOSIS — M222X1 Patellofemoral disorders, right knee: Secondary | ICD-10-CM

## 2019-08-12 DIAGNOSIS — M6281 Muscle weakness (generalized): Secondary | ICD-10-CM | POA: Diagnosis not present

## 2019-08-12 NOTE — Therapy (Signed)
[place], [place], [address] Phone: [phone]   Fax:  [phone]  Physical Therapy Treatment  Patient Details  Name: Peter Erickson MRN: [id number] Date of Birth: [birth year] Referring Provider (PT): Scarlette Ar, MD   Encounter Date: 08/12/2019  PT End of Session - 08/12/19 1055    Visit Number  12    Date for PT Re-Evaluation  10/12/19    Authorization Type  BCBS    PT Start Time  1015    PT Stop Time  1100    PT Time Calculation (min)  45 min    Activity Tolerance  Patient tolerated treatment well    Behavior During Therapy  [facility] for tasks assessed/performed       History reviewed. No pertinent past medical history.  History reviewed. No pertinent surgical history.  There were no vitals filed for this visit.  Subjective Assessment - 08/12/19 1015    Subjective  Pt went for 3 mile run on Sunday and reports knees were very sore after    Currently in Pain?  Yes    Pain Score  1     Pain Location  Knee    Pain Orientation  Right;Left                        [facility] Adult PT Treatment/Exercise - 08/12/19 0001      Knee/Hip Exercises: Stretches   Active Hamstring Stretch  Both;1 rep;30 seconds    Active Hamstring Stretch Limitations  long sitting    Gastroc Stretch  Both;2 reps;60 seconds    Soleus Stretch  Both;30 seconds      Knee/Hip Exercises: Aerobic   Elliptical  I12 R5 4 min fwd/3 min bkwd      Knee/Hip Exercises: Machines for Strengthening   Cybex Knee Extension  35# 2x10    Cybex Knee Flexion  45# 2x10    Total Gym Leg Press  100# 2x10 BLE, 50# 2x10 LLE/RLE    Other Machine  resisted gait, 35# B, fwd back sidetep 5x      Knee/Hip Exercises: Plyometrics   Bilateral Jumping  2 sets;10 reps;Box Height: 6"    Other Plyometric Exercises  side stepping up to 6" box B x10      Knee/Hip Exercises: Standing   Step Down  Both;2 sets;10 reps;Step Height: 6"    Functional Squat  2 sets;10 reps    Functional Squat Limitations  SLS to chair    SLS  on dynadisc B x 30 sec    Other Standing Knee Exercises  split squats no weight x12 B, step up to mat x12 B    Other Standing Knee Exercises  single leg RDL 16# 1x10 B               PT Short Term Goals - 08/05/19 0946      PT SHORT TERM GOAL #1   Title  Pt will demonstrate ability to perform bridge, maintaining posterior pelvic tilt without pain.    Time  2    Period  Weeks    Status  Achieved    Target Date  07/15/19      PT SHORT TERM GOAL #2   Title  Pt will increase L hip abduction strength to 4+/5 for improved HKA alignment.    Baseline  4-/5    Time  4    Status  On-going    Target  Date  07/29/19      PT SHORT TERM GOAL #3   Title  Pt will increase hip extension strength to 4+/5 B.    Baseline  4/5 L, 4+/5 R    Time  4    Period  Weeks    Status  On-going    Target Date  07/29/19        PT Long Term Goals - 08/05/19 0953      PT LONG TERM GOAL #1   Title  Pt will be able to return to running without pain, understanding how to adapt load progressively over time for injury prevention.    Time  6    Period  Weeks    Status  Partially Met      PT LONG TERM GOAL #2   Title  Pt will demonstrate 10 single leg squats B with proper form and mechanics, pain free.    Baseline  not completely pain free    Time  6    Status  Partially Met      PT LONG TERM GOAL #3   Title  Pt will have no knee pain negotiating stairs for home and community access.    Time  6    Period  Weeks    Status  Achieved            Plan - 08/12/19 1056    Clinical Impression Statement  Pt ramped up running to 3 miles this past weekend; experienced soreness in B knees for 2-3 days after. Encouraged to scale back distance to 2.75 miles for 1-2 weeks. Pt did well with SLS and plyometric ex's this rx. Continue to progress.    PT Treatment/Interventions  Cryotherapy;Electrical  Stimulation;Iontophoresis 32m/ml Dexamethasone;Moist Heat;Stair training;Gait training;Functional mobility training;Therapeutic activities;Neuromuscular re-education;Balance training;Therapeutic exercise;Patient/family education;Dry needling;Passive range of motion;Manual techniques;Joint Manipulations;Vasopneumatic Device    PT Next Visit Plan  Progress LE strengthening/flexibility static and dynamic ex's, manual as indicated, return to running training/analysis as indicated.    Consulted and Agree with Plan of Care  Patient       Patient will benefit from skilled therapeutic intervention in order to improve the following deficits and impairments:  Hypermobility, Decreased strength, Increased fascial restricitons, Impaired flexibility, Postural dysfunction, Pain, Improper body mechanics, Impaired perceived functional ability, Decreased activity tolerance  Visit Diagnosis: Patellofemoral pain syndrome of left knee  Patellofemoral pain syndrome of right knee  Prepatellar bursitis, left knee  Left knee pain, unspecified chronicity  Right knee pain, unspecified chronicity  Muscle weakness (generalized)     Problem List Patient Active Problem List   Diagnosis Date Noted  . Prepatellar bursitis, left knee 06/13/2019  . Patellofemoral pain syndrome of right knee 06/13/2019  . Patellofemoral pain syndrome of left knee 04/21/2019   AAmador Cunas PT, DPT ADonald ProseSugg 08/12/2019, 10:58 AM  [place] [address]Phone: [phone]  Fax:  [phone] Name: Peter ConlyMRN: [id number] of Birth: [birth year]

## 2019-08-15 ENCOUNTER — Other Ambulatory Visit: Payer: Self-pay

## 2019-08-15 ENCOUNTER — Encounter: Payer: Self-pay | Admitting: Physical Therapy

## 2019-08-15 ENCOUNTER — Ambulatory Visit: Payer: BC Managed Care – PPO | Admitting: Physical Therapy

## 2019-08-15 DIAGNOSIS — R293 Abnormal posture: Secondary | ICD-10-CM | POA: Diagnosis not present

## 2019-08-15 DIAGNOSIS — M222X2 Patellofemoral disorders, left knee: Secondary | ICD-10-CM

## 2019-08-15 DIAGNOSIS — M25562 Pain in left knee: Secondary | ICD-10-CM | POA: Diagnosis not present

## 2019-08-15 DIAGNOSIS — M6281 Muscle weakness (generalized): Secondary | ICD-10-CM | POA: Diagnosis not present

## 2019-08-15 DIAGNOSIS — M25561 Pain in right knee: Secondary | ICD-10-CM | POA: Diagnosis not present

## 2019-08-15 DIAGNOSIS — M222X1 Patellofemoral disorders, right knee: Secondary | ICD-10-CM

## 2019-08-15 DIAGNOSIS — M7042 Prepatellar bursitis, left knee: Secondary | ICD-10-CM | POA: Diagnosis not present

## 2019-08-15 NOTE — Therapy (Signed)
[place], [place], [address] Phone: [phone]   Fax:  [phone]  Physical Therapy Treatment  Patient Details  Name: Peter Erickson MRN: [id number] Date of Birth: 1976/01/27 Referring Provider (PT): Scarlette Ar, MD   Encounter Date: 08/15/2019  PT End of Session - 08/15/19 0936    Visit Number  13    Date for PT Re-Evaluation  10/12/19    Authorization Type  BCBS    PT Start Time  0845    PT Stop Time  0930    PT Time Calculation (min)  45 min    Activity Tolerance  Patient tolerated treatment well    Behavior During Therapy  [facility] for tasks assessed/performed       History reviewed. No pertinent past medical history.  History reviewed. No pertinent surgical history.  There were no vitals filed for this visit.  Subjective Assessment - 08/15/19 0844    Subjective  Pt reports knees very sore after yoga yesterday; lots of lunges/child's pose/deep squats that irritated knees.    Currently in Pain?  Yes    Pain Score  3     Pain Location  Knee    Pain Orientation  Right;Left                        [facility] Adult PT Treatment/Exercise - 08/15/19 0001      Exercises   Exercises  Knee/Hip      Knee/Hip Exercises: Stretches   ITB Stretch  Both;1 rep;30 seconds      Knee/Hip Exercises: Aerobic   Elliptical  I12 R5 4 min fwd/3 min bkwd      Knee/Hip Exercises: Machines for Strengthening   Other Machine  resisted gait, 35# B, fwd back sidetep 5x      Knee/Hip Exercises: Standing   Heel Raises  Both;2 sets;15 reps    Step Down  Both;2 sets;10 reps;Step Height: 6"    Other Standing Knee Exercises  hip abduction/monster walks with blue TB around ankles x3     Other Standing Knee Exercises  single leg RDL 16# 1x10 B      Knee/Hip Exercises: Supine   Patellar Mobs  x1 min lateral B      Manual Therapy   Manual Therapy  Taping    Manual therapy comments  kinesiotape R  patellar tendon               PT Short Term Goals - 08/05/19 0946      PT SHORT TERM GOAL #1   Title  Pt will demonstrate ability to perform bridge, maintaining posterior pelvic tilt without pain.    Time  2    Period  Weeks    Status  Achieved    Target Date  07/15/19      PT SHORT TERM GOAL #2   Title  Pt will increase L hip abduction strength to 4+/5 for improved HKA alignment.    Baseline  4-/5    Time  4    Status  On-going    Target Date  07/29/19      PT SHORT TERM GOAL #3   Title  Pt will increase hip extension strength to 4+/5 B.    Baseline  4/5 L, 4+/5 R    Time  4    Period  Weeks    Status  On-going    Target Date  07/29/19  PT Long Term Goals - 08/05/19 0953      PT LONG TERM GOAL #1   Title  Pt will be able to return to running without pain, understanding how to adapt load progressively over time for injury prevention.    Time  6    Period  Weeks    Status  Partially Met      PT LONG TERM GOAL #2   Title  Pt will demonstrate 10 single leg squats B with proper form and mechanics, pain free.    Baseline  not completely pain free    Time  6    Status  Partially Met      PT LONG TERM GOAL #3   Title  Pt will have no knee pain negotiating stairs for home and community access.    Time  6    Period  Weeks    Status  Achieved            Plan - 08/15/19 0936    Clinical Impression Statement  Pt is making progress with strength, flexibility, and running distance; however pt continues to experience recurrence of knee pain with deep squats and increases in running distance. Pt was tender to palpation B knees along lateral joint line and over patellar tendon R>L. Patellar mobs B and applied kinesiotape to unload the patellar tendon. Assess response to tape next rx.    PT Treatment/Interventions  Cryotherapy;Electrical Stimulation;Iontophoresis '4mg'$ /ml Dexamethasone;Moist Heat;Stair training;Gait training;Functional mobility  training;Therapeutic activities;Neuromuscular re-education;Balance training;Therapeutic exercise;Patient/family education;Dry needling;Passive range of motion;Manual techniques;Joint Manipulations;Vasopneumatic Device    PT Next Visit Plan  Progress LE strengthening/flexibility static and dynamic ex's, manual as indicated, return to running training/analysis as indicated.    Consulted and Agree with Plan of Care  Patient       Patient will benefit from skilled therapeutic intervention in order to improve the following deficits and impairments:  Hypermobility, Decreased strength, Increased fascial restricitons, Impaired flexibility, Postural dysfunction, Pain, Improper body mechanics, Impaired perceived functional ability, Decreased activity tolerance  Visit Diagnosis: Patellofemoral pain syndrome of left knee  Prepatellar bursitis, left knee  Patellofemoral pain syndrome of right knee  Left knee pain, unspecified chronicity  Right knee pain, unspecified chronicity  Muscle weakness (generalized)     Problem List Patient Active Problem List   Diagnosis Date Noted  . Prepatellar bursitis, left knee 06/13/2019  . Patellofemoral pain syndrome of right knee 06/13/2019  . Patellofemoral pain syndrome of left knee 04/21/2019   Peter Erickson, PT, DPT Peter Erickson 08/15/2019, 9:46 AM  [place] [address] [place], [place], [address] Phone: [phone]   Fax:  [phone]  Name: Peter Erickson MRN: [id number] Date of Birth: 09-24-1975

## 2019-08-20 ENCOUNTER — Other Ambulatory Visit: Payer: Self-pay

## 2019-08-20 ENCOUNTER — Encounter: Payer: Self-pay | Admitting: Physical Therapy

## 2019-08-20 ENCOUNTER — Ambulatory Visit: Payer: BC Managed Care – PPO | Attending: Family Medicine | Admitting: Physical Therapy

## 2019-08-20 DIAGNOSIS — M25561 Pain in right knee: Secondary | ICD-10-CM | POA: Diagnosis not present

## 2019-08-20 DIAGNOSIS — M222X2 Patellofemoral disorders, left knee: Secondary | ICD-10-CM | POA: Insufficient documentation

## 2019-08-20 DIAGNOSIS — M7042 Prepatellar bursitis, left knee: Secondary | ICD-10-CM | POA: Diagnosis not present

## 2019-08-20 DIAGNOSIS — M25562 Pain in left knee: Secondary | ICD-10-CM | POA: Insufficient documentation

## 2019-08-20 DIAGNOSIS — M222X1 Patellofemoral disorders, right knee: Secondary | ICD-10-CM

## 2019-08-20 DIAGNOSIS — M6281 Muscle weakness (generalized): Secondary | ICD-10-CM | POA: Diagnosis not present

## 2019-08-20 NOTE — Therapy (Signed)
[place], [place], [address] Phone: [phone]   Fax:  [phone]  Physical Therapy Treatment  Patient Details  Name: Peter Erickson MRN: [id number] Date of Birth: January 19, 1976 Referring Provider (PT): Scarlette Ar, MD   Encounter Date: 08/20/2019  PT End of Session - 08/20/19 0929    Visit Number  14    Date for PT Re-Evaluation  10/12/19    PT Start Time  0845    PT Stop Time  0928    PT Time Calculation (min)  43 min    Activity Tolerance  Patient tolerated treatment well    Behavior During Therapy  [facility] for tasks assessed/performed       History reviewed. No pertinent past medical history.  History reviewed. No pertinent surgical history.  There were no vitals filed for this visit.  Subjective Assessment - 08/20/19 0852    Subjective  Pt reports he was able to run 3 miles on Sunday and [place] with very little increased soreness in knees. Would like to try taping again.    Currently in Pain?  Yes    Pain Score  1     Pain Location  Knee    Pain Orientation  Right                        [facility] Adult PT Treatment/Exercise - 08/20/19 0001      Knee/Hip Exercises: Stretches   Gastroc Stretch  Both;1 rep;30 seconds    Soleus Stretch  Both;30 seconds      Knee/Hip Exercises: Aerobic   Elliptical  I12 R5 4 min fwd/3 min bkwd      Knee/Hip Exercises: Machines for Strengthening   Cybex Knee Extension  35# 2x10    Cybex Knee Flexion  45# 2x10    Total Gym Leg Press  100# 2x10 BLE; 40# 2x10 RLE/LLE    Other Machine  resisted gait, 35# B, fwd back sidetep 5x      Knee/Hip Exercises: Plyometrics   Bilateral Jumping  2 sets;10 reps;Box Height: 8"      Knee/Hip Exercises: Standing   Heel Raises  Both;2 sets;15 reps    Step Down  Both;2 sets;10 reps;Step Height: 6"    Functional Squat  2 sets;10 reps    Functional Squat Limitations  SLS to chair       Manual Therapy   Manual Therapy  Taping    Manual therapy comments  kinesiotape R patellar tendon             PT Education - 08/20/19 0929    Education Details  Pt educated on application of k-tape to unload patellar tendon    Person(s) Educated  Patient    Methods  Explanation;Demonstration    Comprehension  Verbalized understanding;Returned demonstration;Need further instruction       PT Short Term Goals - 08/05/19 0946      PT SHORT TERM GOAL #1   Title  Pt will demonstrate ability to perform bridge, maintaining posterior pelvic tilt without pain.    Time  2    Period  Weeks    Status  Achieved    Target Date  07/15/19      PT SHORT TERM GOAL #2   Title  Pt will increase L hip abduction strength to 4+/5 for improved HKA alignment.    Baseline  4-/5  Time  4    Status  On-going    Target Date  07/29/19      PT SHORT TERM GOAL #3   Title  Pt will increase hip extension strength to 4+/5 B.    Baseline  4/5 L, 4+/5 R    Time  4    Period  Weeks    Status  On-going    Target Date  07/29/19        PT Long Term Goals - 08/05/19 0953      PT LONG TERM GOAL #1   Title  Pt will be able to return to running without pain, understanding how to adapt load progressively over time for injury prevention.    Time  6    Period  Weeks    Status  Partially Met      PT LONG TERM GOAL #2   Title  Pt will demonstrate 10 single leg squats B with proper form and mechanics, pain free.    Baseline  not completely pain free    Time  6    Status  Partially Met      PT LONG TERM GOAL #3   Title  Pt will have no knee pain negotiating stairs for home and community access.    Time  6    Period  Weeks    Status  Achieved            Plan - 08/20/19 0930    Clinical Impression Statement  Pt returns to clinic with reports that kinesiotape to R patellar tendon seemed to help with running; able to run 3 miles with no increased soreness in R knee. Applied k-tape again  this session; educated pt on how to apply k-tape on his own. Pt returned demonstration; reinforce and have pt practice applying tape again next rx. Instructed pt to tape before running until pain in patellar tendon begins to ease. Pt scheduled 2 more weeks of visit with anticipated d/c at last scheduled visit. Pt reported no increase in knee pain/soreness with any ex's this rx.    PT Treatment/Interventions  Cryotherapy;Electrical Stimulation;Iontophoresis '4mg'$ /ml Dexamethasone;Moist Heat;Stair training;Gait training;Functional mobility training;Therapeutic activities;Neuromuscular re-education;Balance training;Therapeutic exercise;Patient/family education;Dry needling;Passive range of motion;Manual techniques;Joint Manipulations;Vasopneumatic Device    PT Next Visit Plan  Progress LE strengthening/flexibility static and dynamic ex's, manual as indicated, return to running training/analysis as indicated. Prepare for d/c at last scheduled rx.    Consulted and Agree with Plan of Care  Patient       Patient will benefit from skilled therapeutic intervention in order to improve the following deficits and impairments:  Hypermobility, Decreased strength, Increased fascial restricitons, Impaired flexibility, Postural dysfunction, Pain, Improper body mechanics, Impaired perceived functional ability, Decreased activity tolerance  Visit Diagnosis: Patellofemoral pain syndrome of left knee  Prepatellar bursitis, left knee  Patellofemoral pain syndrome of right knee  Left knee pain, unspecified chronicity  Right knee pain, unspecified chronicity  Muscle weakness (generalized)     Problem List Patient Active Problem List   Diagnosis Date Noted   Prepatellar bursitis, left knee 06/13/2019   Patellofemoral pain syndrome of right knee 06/13/2019   Patellofemoral pain syndrome of left knee 04/21/2019   Amador Cunas, PT, DPT Donald Prose Carolos Fecher 08/20/2019, 9:34 AM  [place] [address] [place], [place], [address] Phone: [phone]   Fax:  [phone]  Name: Peter Erickson MRN: [id number] Date of Birth: [birth year]

## 2019-08-22 ENCOUNTER — Other Ambulatory Visit: Payer: Self-pay

## 2019-08-22 ENCOUNTER — Encounter: Payer: Self-pay | Admitting: Physical Therapy

## 2019-08-22 ENCOUNTER — Ambulatory Visit: Payer: BC Managed Care – PPO | Admitting: Physical Therapy

## 2019-08-22 DIAGNOSIS — M222X1 Patellofemoral disorders, right knee: Secondary | ICD-10-CM

## 2019-08-22 DIAGNOSIS — M222X2 Patellofemoral disorders, left knee: Secondary | ICD-10-CM

## 2019-08-22 DIAGNOSIS — M6281 Muscle weakness (generalized): Secondary | ICD-10-CM | POA: Diagnosis not present

## 2019-08-22 DIAGNOSIS — M25562 Pain in left knee: Secondary | ICD-10-CM | POA: Diagnosis not present

## 2019-08-22 DIAGNOSIS — M25561 Pain in right knee: Secondary | ICD-10-CM

## 2019-08-22 DIAGNOSIS — M7042 Prepatellar bursitis, left knee: Secondary | ICD-10-CM

## 2019-08-22 NOTE — Therapy (Signed)
[place], [place], [address] Phone: [phone]   Fax:  [phone]  Physical Therapy Treatment  Patient Details  Name: Peter Erickson MRN: [id number] Date of Birth: December 28, 1975 Referring Provider (PT): Scarlette Ar, MD   Encounter Date: 08/22/2019  PT End of Session - 08/22/19 1008    Visit Number  15    Date for PT Re-Evaluation  10/12/19    PT Start Time  0927    PT Stop Time  1008    PT Time Calculation (min)  41 min    Activity Tolerance  Patient tolerated treatment well    Behavior During Therapy  [facility] for tasks assessed/performed       History reviewed. No pertinent past medical history.  History reviewed. No pertinent surgical history.  There were no vitals filed for this visit.  Subjective Assessment - 08/22/19 0929    Subjective  "Feeling great" R knee is a little sore    Currently in Pain?  Yes    Pain Score  2     Pain Location  Knee    Pain Orientation  Right    Pain Descriptors / Indicators  Sore                        [facility] Adult PT Treatment/Exercise - 08/22/19 0001      High Level Balance   High Level Balance Comments  On dynadisk seated chair position ball toss       Knee/Hip Exercises: Aerobic   Elliptical  I13 R5 7 min fwd/3 min bkwd      Knee/Hip Exercises: Machines for Strengthening   Cybex Knee Extension  35# 2x10    Cybex Knee Flexion  45# 2x10    Total Gym Leg Press  100# 2x10 BLE; 40# 2x10 RLE/LLE      Knee/Hip Exercises: Plyometrics   Bilateral Jumping  3 sets;5 reps   on mat table, 2 sets with turns    Other Plyometric Exercises  6in eccentric decents 2x10 each    Other Plyometric Exercises  seated on 14 in surface jumping to mat table 2x5       Knee/Hip Exercises: Standing   Heel Raises  2 sets;10 reps   Single leg    Other Standing Knee Exercises  BOSU squats 2x15     Other Standing Knee Exercises  single leg RDL 9lb with  vectors 8 reps                PT Short Term Goals - 08/05/19 0946      PT SHORT TERM GOAL #1   Title  Pt will demonstrate ability to perform bridge, maintaining posterior pelvic tilt without pain.    Time  2    Period  Weeks    Status  Achieved    Target Date  07/15/19      PT SHORT TERM GOAL #2   Title  Pt will increase L hip abduction strength to 4+/5 for improved HKA alignment.    Baseline  4-/5    Time  4    Status  On-going    Target Date  07/29/19      PT SHORT TERM GOAL #3   Title  Pt will increase hip extension strength to 4+/5 B.    Baseline  4/5 L, 4+/5 R    Time  4    Period  Weeks  Status  On-going    Target Date  07/29/19        PT Long Term Goals - 08/05/19 0953      PT LONG TERM GOAL #1   Title  Pt will be able to return to running without pain, understanding how to adapt load progressively over time for injury prevention.    Time  6    Period  Weeks    Status  Partially Met      PT LONG TERM GOAL #2   Title  Pt will demonstrate 10 single leg squats B with proper form and mechanics, pain free.    Baseline  not completely pain free    Time  6    Status  Partially Met      PT LONG TERM GOAL #3   Title  Pt will have no knee pain negotiating stairs for home and community access.    Time  6    Period  Weeks    Status  Achieved            Plan - 08/22/19 1009    Clinical Impression Statement  Pt still had K tape applied upon entering. He report that's he felt fine doing it himself. Progressed with some balance and plyo interventions. Some LE weakness present with SL DL with resisted vectors. Pt reports more difficulty turning to his R with box jumps. Visible LE shaking with controlled eccentric decent's.    Personal Factors and Comorbidities  Time since onset of injury/illness/exacerbation;Fitness    Examination-Activity Limitations  Stairs;Locomotion Level;Squat    Examination-Participation Restrictions  Community Activity;Other     Stability/Clinical Decision Making  Stable/Uncomplicated    Rehab Potential  Excellent    PT Frequency  2x / week    PT Treatment/Interventions  Cryotherapy;Electrical Stimulation;Iontophoresis '4mg'$ /ml Dexamethasone;Moist Heat;Stair training;Gait training;Functional mobility training;Therapeutic activities;Neuromuscular re-education;Balance training;Therapeutic exercise;Patient/family education;Dry needling;Passive range of motion;Manual techniques;Joint Manipulations;Vasopneumatic Device    PT Next Visit Plan  Progress LE strengthening/flexibility static and dynamic ex's, manual as indicated, return to running training/analysis as indicated. Prepare for d/c at last scheduled rx.       Patient will benefit from skilled therapeutic intervention in order to improve the following deficits and impairments:  Hypermobility, Decreased strength, Increased fascial restricitons, Impaired flexibility, Postural dysfunction, Pain, Improper body mechanics, Impaired perceived functional ability, Decreased activity tolerance  Visit Diagnosis: Patellofemoral pain syndrome of right knee  Left knee pain, unspecified chronicity  Right knee pain, unspecified chronicity  Prepatellar bursitis, left knee  Patellofemoral pain syndrome of left knee     Problem List Patient Active Problem List   Diagnosis Date Noted   Prepatellar bursitis, left knee 06/13/2019   Patellofemoral pain syndrome of right knee 06/13/2019   Patellofemoral pain syndrome of left knee 04/21/2019    Scot Jun, PTA 08/22/2019, 10:11 AM  [place] [address] [place], [place], [address] Phone: [phone]   Fax:  [phone]  Name: Peter Erickson MRN: [id number] Date of Birth: 09/20/1975

## 2019-08-26 ENCOUNTER — Other Ambulatory Visit: Payer: Self-pay

## 2019-08-26 ENCOUNTER — Encounter: Payer: Self-pay | Admitting: Physical Therapy

## 2019-08-26 ENCOUNTER — Ambulatory Visit: Payer: BC Managed Care – PPO | Admitting: Physical Therapy

## 2019-08-26 DIAGNOSIS — M25562 Pain in left knee: Secondary | ICD-10-CM

## 2019-08-26 DIAGNOSIS — M222X1 Patellofemoral disorders, right knee: Secondary | ICD-10-CM

## 2019-08-26 DIAGNOSIS — M25561 Pain in right knee: Secondary | ICD-10-CM | POA: Diagnosis not present

## 2019-08-26 DIAGNOSIS — M6281 Muscle weakness (generalized): Secondary | ICD-10-CM | POA: Diagnosis not present

## 2019-08-26 DIAGNOSIS — M222X2 Patellofemoral disorders, left knee: Secondary | ICD-10-CM | POA: Diagnosis not present

## 2019-08-26 DIAGNOSIS — M7042 Prepatellar bursitis, left knee: Secondary | ICD-10-CM | POA: Diagnosis not present

## 2019-08-26 NOTE — Therapy (Signed)
[place], [place], [address] Phone: [phone]   Fax:  [phone]  Physical Therapy Treatment  Patient Details  Name: Peter Erickson MRN: [id number] Date of Birth: 04/03/1975 Referring Provider (PT): Scarlette Ar, MD   Encounter Date: 08/26/2019  PT End of Session - 08/26/19 0926    Visit Number  16    Date for PT Re-Evaluation  10/12/19    PT Start Time  0845    PT Stop Time  0927    PT Time Calculation (min)  42 min    Activity Tolerance  Patient tolerated treatment well    Behavior During Therapy  [facility] for tasks assessed/performed       History reviewed. No pertinent past medical history.  History reviewed. No pertinent surgical history.  There were no vitals filed for this visit.  Subjective Assessment - 08/26/19 0844    Subjective  Saturday felt fine went for a hike, Sunday pain from hip flexor to inside groin, some pain Monday but less. Today L knee is a little sore    Currently in Pain?  Yes    Pain Score  1     Pain Location  Knee    Pain Orientation  Left;Right    Pain Descriptors / Indicators  Sore                        [facility] Adult PT Treatment/Exercise - 08/26/19 0001      High Level Balance   High Level Balance Comments  SLS on airex cone tap ball toss,       Knee/Hip Exercises: Aerobic   Elliptical  I13 R5 7 min fwd/3 min bkwd      Knee/Hip Exercises: Machines for Strengthening   Cybex Knee Extension  35# 2x10    Cybex Knee Flexion  45# 2x10    Total Gym Leg Press  100# 2x10 BLE; 40# 2x10 RLE/LLE      Knee/Hip Exercises: Plyometrics   Other Plyometric Exercises  Lunges 15lb anterior pull 5lb dumbbells 2x10 each, 6 inch controlled descents     Other Plyometric Exercises  Lateal hop with vextors, Seated om 14 in surface jumping to mat tabl2 2x5      Knee/Hip Exercises: Standing   Heel Raises  2 sets;10 reps;Left;Right   single leg   Walking with  Sports Cord  30 lb side step over foam rolls x5 each                PT Short Term Goals - 08/05/19 0946      PT SHORT TERM GOAL #1   Title  Pt will demonstrate ability to perform bridge, maintaining posterior pelvic tilt without pain.    Time  2    Period  Weeks    Status  Achieved    Target Date  07/15/19      PT SHORT TERM GOAL #2   Title  Pt will increase L hip abduction strength to 4+/5 for improved HKA alignment.    Baseline  4-/5    Time  4    Status  On-going    Target Date  07/29/19      PT SHORT TERM GOAL #3   Title  Pt will increase hip extension strength to 4+/5 B.    Baseline  4/5 L, 4+/5 R    Time  4    Period  Weeks  Status  On-going    Target Date  07/29/19        PT Long Term Goals - 08/05/19 0953      PT LONG TERM GOAL #1   Title  Pt will be able to return to running without pain, understanding how to adapt load progressively over time for injury prevention.    Time  6    Period  Weeks    Status  Partially Met      PT LONG TERM GOAL #2   Title  Pt will demonstrate 10 single leg squats B with proper form and mechanics, pain free.    Baseline  not completely pain free    Time  6    Status  Partially Met      PT LONG TERM GOAL #3   Title  Pt will have no knee pain negotiating stairs for home and community access.    Time  6    Period  Weeks    Status  Achieved            Plan - 08/26/19 4166    Clinical Impression Statement  Pt able to complete all of today's interventions. He did have some hip burning and fatigue form the lateral movements. Difficulty with SLS on the ariex taping cones and tossing ball. Cues needed to shorten stride with forward lunges.    Personal Factors and Comorbidities  Time since onset of injury/illness/exacerbation;Fitness    Examination-Activity Limitations  Stairs;Locomotion Level;Squat    Examination-Participation Restrictions  Community Activity;Other    Stability/Clinical Decision Making   Stable/Uncomplicated    Rehab Potential  Excellent    PT Frequency  2x / week    PT Duration  6 weeks    PT Treatment/Interventions  Cryotherapy;Electrical Stimulation;Iontophoresis '4mg'$ /ml Dexamethasone;Moist Heat;Stair training;Gait training;Functional mobility training;Therapeutic activities;Neuromuscular re-education;Balance training;Therapeutic exercise;Patient/family education;Dry needling;Passive range of motion;Manual techniques;Joint Manipulations;Vasopneumatic Device    PT Next Visit Plan  Progress LE strengthening/flexibility static and dynamic ex's, manual as indicated, return to running training/analysis as indicated. Prepare for d/c at last scheduled rx.       Patient will benefit from skilled therapeutic intervention in order to improve the following deficits and impairments:  Hypermobility, Decreased strength, Increased fascial restricitons, Impaired flexibility, Postural dysfunction, Pain, Improper body mechanics, Impaired perceived functional ability, Decreased activity tolerance  Visit Diagnosis: Left knee pain, unspecified chronicity  Right knee pain, unspecified chronicity  Patellofemoral pain syndrome of right knee     Problem List Patient Active Problem List   Diagnosis Date Noted  . Prepatellar bursitis, left knee 06/13/2019  . Patellofemoral pain syndrome of right knee 06/13/2019  . Patellofemoral pain syndrome of left knee 04/21/2019    Scot Jun, PTA 08/26/2019, 9:29 AM  [place], [place], [address] Phone: [phone]   Fax:  [phone]  Name: Peter Erickson MRN: [id number] Date of Birth: [birth year]

## 2019-09-02 ENCOUNTER — Encounter: Payer: Self-pay | Admitting: Physical Therapy

## 2019-09-02 ENCOUNTER — Ambulatory Visit: Payer: BC Managed Care – PPO | Admitting: Physical Therapy

## 2019-09-02 ENCOUNTER — Other Ambulatory Visit: Payer: Self-pay

## 2019-09-02 DIAGNOSIS — M6281 Muscle weakness (generalized): Secondary | ICD-10-CM | POA: Diagnosis not present

## 2019-09-02 DIAGNOSIS — M25562 Pain in left knee: Secondary | ICD-10-CM | POA: Diagnosis not present

## 2019-09-02 DIAGNOSIS — M222X2 Patellofemoral disorders, left knee: Secondary | ICD-10-CM

## 2019-09-02 DIAGNOSIS — M7042 Prepatellar bursitis, left knee: Secondary | ICD-10-CM | POA: Diagnosis not present

## 2019-09-02 DIAGNOSIS — M25561 Pain in right knee: Secondary | ICD-10-CM

## 2019-09-02 DIAGNOSIS — M222X1 Patellofemoral disorders, right knee: Secondary | ICD-10-CM | POA: Diagnosis not present

## 2019-09-02 NOTE — Therapy (Signed)
[facility] [place] [address] [place], [place], [address] Phone: [phone]   Fax:  [phone]  Physical Therapy Treatment  Patient Details  Name: Journee Kohen MRN: [id number] Date of Birth: 06-18-1975 Referring Provider (PT): Jenita Seashore, MD   Encounter Date: 09/02/2019   PT End of Session - 09/02/19 0927    Visit Number 17    Date for PT Re-Evaluation 10/12/19    PT Start Time 0845    PT Stop Time 0925    PT Time Calculation (min) 40 min    Activity Tolerance Patient tolerated treatment well    Behavior During Therapy [facility] for tasks assessed/performed           History reviewed. No pertinent past medical history.  History reviewed. No pertinent surgical history.  There were no vitals filed for this visit.   Subjective Assessment - 09/02/19 0847    Subjective Pt reports he has been feeling really good; states that he is ready to end PT and feels like he can manage any soreness on his own.    Currently in Pain? No/denies    Pain Score 0-No pain    Pain Location Knee                             [facility] Adult PT Treatment/Exercise - 09/02/19 0001      Knee/Hip Exercises: Stretches   Gastroc Stretch Both;1 rep;60 seconds    Soleus Stretch Both;1 rep;60 seconds      Knee/Hip Exercises: Machines for Strengthening   Cybex Knee Extension 45# 2x10    Cybex Knee Flexion 45# 2x10    Total Gym Leg Press 100# 2x15 BLE; 40# 2x10 RLE/LLE      Knee/Hip Exercises: Standing   Heel Raises 2 sets;10 reps;Left;Right    Functional Squat Limitations SLS to chair x10 B    Walking with Sports Cord 70# x5 each direction    Other Standing Knee Exercises BOSU squats 2x15 , SLS on foam with vectors and ball toss    Other Standing Knee Exercises single leg RDL 9# x10 B, walking lunges 18# down/back x2                    PT Short Term Goals - 08/05/19 0946      PT SHORT TERM GOAL #1   Title Pt will  demonstrate ability to perform bridge, maintaining posterior pelvic tilt without pain.    Time 2    Period Weeks    Status Achieved    Target Date 07/15/19      PT SHORT TERM GOAL #2   Title Pt will increase L hip abduction strength to 4+/5 for improved HKA alignment.    Baseline 4-/5    Time 4    Status On-going    Target Date 07/29/19      PT SHORT TERM GOAL #3   Title Pt will increase hip extension strength to 4+/5 B.    Baseline 4/5 L, 4+/5 R    Time 4    Period Weeks    Status On-going    Target Date 07/29/19             PT Long Term Goals - 09/02/19 0927      PT LONG TERM GOAL #1   Title Pt will be able to return to running without pain, understanding how to adapt load progressively over time  for injury prevention.    Time 6    Period Weeks    Status Achieved      PT LONG TERM GOAL #2   Title Pt will demonstrate 10 single leg squats B with proper form and mechanics, pain free.    Baseline not completely pain free    Time 6    Status Achieved      PT LONG TERM GOAL #3   Title Pt will have no knee pain negotiating stairs for home and community access.    Time 6    Period Weeks    Status Achieved                 Plan - 09/02/19 0925    Clinical Impression Statement Pt did well with all interventions today; no reports of increase in B knee pain/soreness. Pt ran 3 miles to clinic with no increased knee soreness. Pt recommended for discharge today secondary to meeting all goals and reports that he is pleased with progress. Educated on returning if symptoms recur.    PT Next Visit Plan Pt recommended for d/c    Consulted and Agree with Plan of Care Patient           Patient will benefit from skilled therapeutic intervention in order to improve the following deficits and impairments:     Visit Diagnosis: Left knee pain, unspecified chronicity  Right knee pain, unspecified chronicity  Patellofemoral pain syndrome of right knee  Prepatellar  bursitis, left knee  Patellofemoral pain syndrome of left knee  Muscle weakness (generalized)     Problem List Patient Active Problem List   Diagnosis Date Noted  . Prepatellar bursitis, left knee 06/13/2019  . Patellofemoral pain syndrome of right knee 06/13/2019  . Patellofemoral pain syndrome of left knee 04/21/2019   Amador Cunas, PT, DPT Donald Prose Liesel Peckenpaugh 09/02/2019, 9:28 AM  [place], [place], [address] Phone: [phone]   Fax:  [phone]  Name: Tarrance Januszewski MRN: [id number] Date of Birth: [birth year]

## 2019-10-28 ENCOUNTER — Encounter: Payer: Self-pay | Admitting: Family Medicine

## 2019-10-31 ENCOUNTER — Ambulatory Visit: Payer: Self-pay

## 2019-10-31 ENCOUNTER — Encounter: Payer: Self-pay | Admitting: Family Medicine

## 2019-10-31 ENCOUNTER — Other Ambulatory Visit: Payer: Self-pay

## 2019-10-31 ENCOUNTER — Ambulatory Visit: Payer: BC Managed Care – PPO | Admitting: Family Medicine

## 2019-10-31 VITALS — BP 110/84 | HR 69 | Ht 70.0 in | Wt 169.8 lb

## 2019-10-31 DIAGNOSIS — G8929 Other chronic pain: Secondary | ICD-10-CM

## 2019-10-31 DIAGNOSIS — M25561 Pain in right knee: Secondary | ICD-10-CM

## 2019-10-31 NOTE — Patient Instructions (Signed)
Plan for MRI.  I will get results to you ASAP.  I will offer a return visit to review findings.  If surgery is obvious then it may make sense to just be referred to surgery.    Meniscus Tear  A meniscus tear is a knee injury that happens when a piece of the meniscus is torn. The meniscus is a thick, rubbery, wedge-shaped cartilage in the knee. Two menisci are located in each knee. They sit between the upper bone (femur) and lower bone (tibia) that make up the knee joint. Each meniscus acts as a shock absorber for the knee. A torn meniscus is one of the most common types of knee injuries. This injury can range from mild to severe. Surgery may be needed to repair a severe tear. What are the causes? This condition may be caused by any kneeling, squatting, twisting, or pivoting movement. Sports-related injuries are the most common cause. These often occur from:  Running and stopping suddenly. ? Changing direction. ? Being tackled or knocked off your feet.  Lifting or carrying heavy weights. As people get older, their menisci get thinner and weaker. In these people, tears can happen more easily, such as from climbing stairs. What increases the risk? You are more likely to develop this condition if you:  Play contact sports.  Have a job that requires kneeling or squatting.  Are male.  Are over 8 years old. What are the signs or symptoms? Symptoms of this condition include:  Knee pain, especially at the side of the knee joint. You may feel pain when the injury occurs, or you may only hear a pop and feel pain later.  A feeling that your knee is clicking, catching, locking, or giving way.  Not being able to fully bend or extend your knee.  Bruising or swelling in your knee. How is this diagnosed? This condition may be diagnosed based on your symptoms and a physical exam. You may also have tests, such as:  X-rays.  MRI.  A procedure to look inside your knee with a narrow  surgical telescope (arthroscopy). You may be referred to a knee specialist (orthopedic surgeon). How is this treated? Treatment for this injury depends on the severity of the tear. Treatment for a mild tear may include:  Rest.  Medicine to reduce pain and swelling. This is usually a nonsteroidal anti-inflammatory drug (NSAID), like ibuprofen.  A knee brace, sleeve, or wrap.  Using crutches or a walker to keep weight off your knee and to help you walk.  Exercises to strengthen your knee (physical therapy). You may need surgery if you have a severe tear or if other treatments are not working. Follow these instructions at home: If you have a brace, sleeve, or wrap:  Wear it as told by your health care provider. Remove it only as told by your health care provider.  Loosen the brace, sleeve, or wrap if your toes tingle, become numb, or turn cold and blue.  Keep the brace, sleeve, or wrap clean and dry.  If the brace, sleeve, or wrap is not waterproof: ? Do not let it get wet. ? Cover it with a watertight covering when you take a bath or shower. Managing pain and swelling   Take over-the-counter and prescription medicines only as told by your health care provider.  If directed, put ice on your knee: ? If you have a removable brace, sleeve, or wrap, remove it as told by your health care provider. ? Put ice  in a plastic bag. ? Place a towel between your skin and the bag. ? Leave the ice on for 20 minutes, 2-3 times per day.  Move your toes often to avoid stiffness and to lessen swelling.  Raise (elevate) the injured area above the level of your heart while you are sitting or lying down. Activity  Do not use the injured limb to support your body weight until your health care provider says that you can. Use crutches or a walker as told by your health care provider.  Return to your normal activities as told by your health care provider. Ask your health care provider what activities  are safe for you.  Perform range-of-motion exercises only as told by your health care provider.  Begin doing exercises to strengthen your knee and leg muscles only as told by your health care provider. After you recover, your health care provider may recommend these exercises to help prevent another injury. General instructions  Use a knee brace, sleeve, or wrap as told by your health care provider.  Ask your health care provider when it is safe to drive if you have a brace, sleeve, or wrap on your knee.  Do not use any products that contain nicotine or tobacco, such as cigarettes, e-cigarettes, and chewing tobacco. If you need help quitting, ask your health care provider.  Ask your health care provider if the medicine prescribed to you: ? Requires you to avoid driving or using heavy machinery. ? Can cause constipation. You may need to take these actions to prevent or treat constipation:  Drink enough fluid to keep your urine pale yellow.  Take over-the-counter or prescription medicines.  Eat foods that are high in fiber, such as beans, whole grains, and fresh fruits and vegetables.  Limit foods that are high in fat and processed sugars, such as fried or sweet foods.  Keep all follow-up visits as told by your health care provider. This is important. Contact a health care provider if:  You have a fever.  Your knee becomes red, tender, or swollen.  Your pain medicine is not helping.  Your symptoms get worse or do not improve after 2 weeks of home care. Summary  A meniscus tear is a knee injury that happens when a piece of the meniscus is torn.  Treatment for this injury depends on the severity of the tear. You may need surgery if you have a severe tear or if other treatments are not working.  Rest, ice, and raise (elevate) your injured knee as told by your health care provider. This will help lessen pain and swelling.  Contact a health care provider if you have new symptoms,  or your symptoms get worse or do not improve after 2 weeks of home care.  Keep all follow-up visits as told by your health care provider. This is important. This information is not intended to replace advice given to you by your health care provider. Make sure you discuss any questions you have with your health care provider. Document Revised: 09/18/2017 Document Reviewed: 09/18/2017 Elsevier Patient Education  2020 [employer].

## 2019-10-31 NOTE — Progress Notes (Signed)
I, Peter Erickson, LAT, ATC, am serving as scribe for Dr. Clementeen Graham.  Peter Erickson is a 44 y.o. male who presents to [facility] at [facility] today for f/u of R knee pain.  He was last seen by Dr. Denyse Amass on 06/13/19 for B knee pain and had a L knee injection.  He was advised to use/wear a Body Helix and to complete PT.  He has completed 17 PT visits and was d/c from PT.  He resumed running and started having pain and swelling in his knee again.  Since his last visit w/ Dr. Denyse Amass, pt reports that his R knee pain flared up over the last 2.5 weeks.  He states that he was feeling really good after PT and then started running again x 2-3 miles about 2 days/week.  He states that his R knee felt swollen and "loose" but does not recall any new MOI.  He locates his pain to his R medial knee.  He is unable to return to running or normal exercise because of his knee.  He has daily pain that is significantly impacting his quality of life.  Diagnostic testing: B knee XR- 06/13/19   Pertinent review of systems: No fevers or chills  Relevant historical information: Seasonal allergies   Exam:  BP 110/84 (BP Location: Left Arm, Patient Position: Sitting, Cuff Size: Normal)    Pulse 69    Ht 5\' 10"  (1.778 m)    Wt 169 lb 12.8 oz (77 kg)    SpO2 98%    BMI 24.36 kg/m  General: Well Developed, well nourished, and in no acute distress.   MSK: Right knee normal-appearing Normal motion. Tender palpation medial joint line. Stable ligamentous exam. Positive medial McMurray's test. Intact strength.    Lab and Radiology Results  Diagnostic Limited MSK Ultrasound of: Right knee Quad tendon intact normal-appearing Trace joint effusion superior patellar space. Patellar tendon normal. Trace hypoechoic fluid tracking deep to patellar tendon distal tendon insertion superficial to Hoffa's fat pad. Lateral joint line normal-appearing Medial joint line normal-appearing with no obvious meniscus  tear. Impression: Normal-appearing knee ultrasound.     Assessment and Plan: 44 y.o. male with right knee pain.  This is persistent ongoing since March.  Patient has had an extensive trial of physical therapy.  X-rays are normal-appearing.  Patient has point tenderness at medial joint line and I am concerned that he has either a medial meniscus tear that is not visible on ultrasound or a focal area of chondromalacia causing his pain.  He is unable to return to normal exercise because of his pain.  Recheck following MRI.  If surgery is obviously indicated may consider just referring directly to surgery.    Orders Placed This Encounter  Procedures   April LIMITED JOINT SPACE STRUCTURES LOW RIGHT(NO LINKED CHARGES)    Order Specific Question:   Reason for Exam (SYMPTOM  OR DIAGNOSIS REQUIRED)    Answer:   R knee pain    Order Specific Question:   Preferred imaging location?    Answer:   [facility]   MR Knee Right Wo Contrast    Standing Status:   Future    Standing Expiration Date:   10/30/2020    Order Specific Question:   What is the patient's sedation requirement?    Answer:   No Sedation    Order Specific Question:   Does the patient have a pacemaker or implanted devices?    Answer:  No    Order Specific Question:   Preferred imaging location?    Answer:   [facility] (table limit-350lbs)    Order Specific Question:   Radiology Contrast Protocol - do NOT remove file path    Answer:   \[email or web address]   No orders of the defined types were placed in this encounter.    Discussed warning signs or symptoms. Please see discharge instructions. Patient expresses understanding.   The above documentation has been reviewed and is accurate and complete Clementeen Graham, M.D.

## 2019-11-11 ENCOUNTER — Other Ambulatory Visit: Payer: Self-pay

## 2019-11-11 ENCOUNTER — Ambulatory Visit (INDEPENDENT_AMBULATORY_CARE_PROVIDER_SITE_OTHER): Payer: BC Managed Care – PPO

## 2019-11-11 DIAGNOSIS — M25561 Pain in right knee: Secondary | ICD-10-CM

## 2019-11-11 DIAGNOSIS — M25562 Pain in left knee: Secondary | ICD-10-CM | POA: Diagnosis not present

## 2019-11-11 DIAGNOSIS — G8929 Other chronic pain: Secondary | ICD-10-CM | POA: Diagnosis not present

## 2019-11-11 NOTE — Progress Notes (Signed)
MRI right knee fortunately does not show meniscus or ligament tear.  You do have some focal cartilage loss which is more mild than severe.  Recommend recheck in clinic to review the results in full detail and discuss our next steps.  Surgery probably not indicated based on what I see here today.

## 2019-11-14 ENCOUNTER — Ambulatory Visit (INDEPENDENT_AMBULATORY_CARE_PROVIDER_SITE_OTHER): Payer: BC Managed Care – PPO

## 2019-11-14 ENCOUNTER — Ambulatory Visit: Payer: BC Managed Care – PPO | Admitting: Family Medicine

## 2019-11-14 ENCOUNTER — Encounter: Payer: Self-pay | Admitting: Family Medicine

## 2019-11-14 ENCOUNTER — Other Ambulatory Visit: Payer: Self-pay

## 2019-11-14 VITALS — BP 110/78 | HR 71 | Ht 70.0 in | Wt 171.2 lb

## 2019-11-14 DIAGNOSIS — G8929 Other chronic pain: Secondary | ICD-10-CM | POA: Diagnosis not present

## 2019-11-14 DIAGNOSIS — M545 Low back pain, unspecified: Secondary | ICD-10-CM

## 2019-11-14 DIAGNOSIS — M25561 Pain in right knee: Secondary | ICD-10-CM | POA: Diagnosis not present

## 2019-11-14 MED ORDER — NITROGLYCERIN 0.2 MG/HR TD PT24
MEDICATED_PATCH | TRANSDERMAL | 1 refills | Status: DC
Start: 2019-11-14 — End: 2020-11-05

## 2019-11-14 NOTE — Patient Instructions (Addendum)
Thank you for coming in today. Plan for labs today and xray lumbar spine today.   Try the nitro patches for a few weeks.  Advance activity as tolerated.   If not better let me know.   Consider nerve study.   Also we may consider a second opinion.   Nitroglycerin Protocol   Apply 1/4 nitroglycerin patch to affected area daily.  Change position of patch within the affected area every 24 hours.  You may experience a headache during the first 1-2 weeks of using the patch, these should subside.  If you experience headaches after beginning nitroglycerin patch treatment, you may take your preferred over the counter pain reliever.  Another side effect of the nitroglycerin patch is skin irritation or rash related to patch adhesive.  Please notify our office if you develop more severe headaches or rash, and stop the patch.  Tendon healing with nitroglycerin patch may require 12 to 24 weeks depending on the extent of injury.  Men should not use if taking Viagra, Cialis, or Levitra.   Do not use if you have migraines or rosacea.

## 2019-11-14 NOTE — Progress Notes (Signed)
I, [name], LAT, ATC, am serving as scribe for Dr. Clementeen Graham.  Peter Erickson is a 44 y.o. male who presents to [facility] at [facility] today for f/u of R knee pain and R knee MRI review.  He was last seen by Dr. Denyse Amass on 10/31/19 w/ a return of R knee pain after he resumed running.  He completed an extensive course of PT x 17 visits that did improve his knee pain that then returned when he attempted to resume running.  Since his last visit, pt reports that his R knee is feeling pretty good.  He states that he hasn't run in 3 weeks but has gone to the gym 2x and done some of the exercises he was shown at PT.  He does note occasional back pain as well.  Diagnostic testing: R knee MRI- 11/11/19; B knee XR- 06/13/19   Pertinent review of systems: No fever or chills  Relevant historical information: Frequent runner   Exam:  BP 110/78 (BP Location: Right Arm, Patient Position: Sitting, Cuff Size: Normal)   Pulse 71   Ht 5\' 10"  (1.778 m)   Wt 171 lb 3.2 oz (77.7 kg)   SpO2 99%   BMI 24.56 kg/m  General: Well Developed, well nourished, and in no acute distress.   MSK: Right knee Normal-appearing normal motion.  Tender palpation medial joint line.  Stable ligamentous exam.  Intact strength. L-spine normal motion   Lab and Radiology Results No results found for this or any previous visit (from the past 72 hour(s)). DG Lumbar Spine 2-3 Views  Result Date: 11/14/2019 CLINICAL DATA:  Pain EXAM: LUMBAR SPINE - 2-3 VIEW COMPARISON:  None. FINDINGS: Frontal, lateral, and spot lumbosacral lateral images were obtained. There are 5 non-rib-bearing lumbar type vertebral bodies. There is no fracture or spondylolisthesis. The disc spaces appear normal. No erosive change. IMPRESSION: No fracture or spondylolisthesis.  No evident arthropathy. Electronically Signed   By: 11/16/2019 III M.D.   On: 11/14/2019 10:33   MR Knee Right Wo Contrast  Result Date:  11/11/2019 CLINICAL DATA:  Intermittent left knee pain for 10 years which worsened after the patient began running earlier this year. No known injury. EXAM: MRI OF THE RIGHT KNEE WITHOUT CONTRAST TECHNIQUE: Multiplanar, multisequence MR imaging of the knee was performed. No intravenous contrast was administered. COMPARISON:  Plain films left knee 06/13/2019. FINDINGS: MENISCI Medial meniscus:  Intact. Lateral meniscus:  Intact. LIGAMENTS Cruciates:  Intact. Collaterals:  Intact. CARTILAGE Patellofemoral:  Preserved. Medial: Mild fraying and irregularity are seen along the central aspect of the medial compartment. No focal defect. Lateral:  Preserved. Joint:  No joint effusion. Popliteal Fossa:  No Baker's cyst. Extensor Mechanism:  Intact. Bones: Normal marrow signal throughout without fracture, stress change or worrisome lesion Other: None. IMPRESSION: Negative for meniscal or ligament tear. Mild degeneration of hyaline cartilage about the medial compartment noted. No focal or full-thickness defect. Electronically Signed   By: 06/15/2019 M.D.   On: 11/11/2019 12:04   I, 11/13/2019, personally (independently) visualized and performed the interpretation of the images attached in this note. Agree with radiology read L-spine.  Largely normal-appearing  MRI images also independently read.  He has what appears to be a small bone island in his medial tibia and in his femoral condyle.  I discussed the case with [facility] radiologist who thinks these bone islands are benign and not causing his pain.  He did notice however small amount of  synovitis which may be contributory.  I agree and see these changes on MRI as well.    Assessment and Plan: 44 y.o. male with persistent knee pain.  Pain mostly medial.  Failing typical conservative management and MRI largely benign.  After discussion with radiologist MRI somewhat concerning for synovitis.  We discussed options.  Plan for trial of nitroglycerin patch protocol.   Also will check lumbar spine x-ray in preparation for a radiculopathy work-up.  Radiculopathy quite unlikely but is a reasonable next step.  Plan for trial of nitroglycerin patch protocol and if not better proceed with steroid injection for possible synovitis.  Addition proceed with limited rheumatologic work-up including sed rate uric acid and metabolic panel.    Orders Placed This Encounter  Procedures  . DG Lumbar Spine 2-3 Views    Standing Status:   Future    Number of Occurrences:   1    Standing Expiration Date:   11/13/2020    Order Specific Question:   Reason for Exam (SYMPTOM  OR DIAGNOSIS REQUIRED)    Answer:   eval right medial knee pain    Order Specific Question:   Preferred imaging location?    Answer:   [facility]    Order Specific Question:   Radiology Contrast Protocol - do NOT remove file path    Answer:   [email or web address]  . BASIC METABOLIC PANEL WITH GFR    Standing Status:   Future    Number of Occurrences:   1    Standing Expiration Date:   11/13/2020  . Uric acid    Standing Status:   Future    Number of Occurrences:   1    Standing Expiration Date:   11/13/2020  . Sedimentation rate    Standing Status:   Future    Number of Occurrences:   1    Standing Expiration Date:   11/13/2020   Meds ordered this encounter  Medications  . nitroGLYCERIN (NITRODUR - DOSED IN MG/24 HR) 0.2 mg/hr patch    Sig: Apply 1/4 patch daily to medial knee for tendonitis.    Dispense:  30 patch    Refill:  1     Discussed warning signs or symptoms. Please see discharge instructions. Patient expresses understanding.   The above documentation has been reviewed and is accurate and complete Clementeen Graham, M.D.  Total encounter time 30 minutes including face-to-face time with the patient and charting on the date of service.

## 2019-11-14 NOTE — Progress Notes (Signed)
X-ray lumbar spine looks pretty normal to radiology.

## 2019-11-15 LAB — BASIC METABOLIC PANEL WITH GFR
BUN: 16 mg/dL (ref 7–25)
CO2: 32 mmol/L (ref 20–32)
Calcium: 10 mg/dL (ref 8.6–10.3)
Chloride: 102 mmol/L (ref 98–110)
Creat: 1 mg/dL (ref 0.60–1.35)
GFR, Est African American: 108 mL/min/{1.73_m2} (ref >=60)
GFR, Est Non African American: 93 mL/min/{1.73_m2} (ref >=60)
Glucose, Bld: 93 mg/dL (ref 65–99)
Potassium: 4.5 mmol/L (ref 3.5–5.3)
Sodium: 139 mmol/L (ref 135–146)

## 2019-11-15 LAB — SEDIMENTATION RATE: Sed Rate: 2 mm/h (ref 0–15)

## 2019-11-15 LAB — URIC ACID: Uric Acid, Serum: 6.3 mg/dL (ref 4.0–8.0)

## 2019-11-18 ENCOUNTER — Encounter: Payer: Self-pay | Admitting: Family Medicine

## 2019-11-18 NOTE — Progress Notes (Signed)
Uric acid very slightly elevated at 6.3.  Doubtful that your pain represents gout.  Sedimentation rate which is a marker for inflammation is normal at 2.  Kidney function is normal

## 2020-04-01 ENCOUNTER — Other Ambulatory Visit: Payer: BC Managed Care – PPO

## 2020-04-16 ENCOUNTER — Other Ambulatory Visit: Payer: Self-pay

## 2020-07-23 ENCOUNTER — Other Ambulatory Visit: Payer: Self-pay

## 2020-07-23 ENCOUNTER — Ambulatory Visit (INDEPENDENT_AMBULATORY_CARE_PROVIDER_SITE_OTHER): Payer: BC Managed Care – PPO

## 2020-07-23 ENCOUNTER — Ambulatory Visit: Payer: Self-pay

## 2020-07-23 ENCOUNTER — Encounter: Payer: Self-pay | Admitting: Family Medicine

## 2020-07-23 ENCOUNTER — Ambulatory Visit: Payer: BC Managed Care – PPO | Admitting: Family Medicine

## 2020-07-23 VITALS — BP 130/86 | HR 59 | Ht 70.0 in | Wt 174.0 lb

## 2020-07-23 DIAGNOSIS — G8929 Other chronic pain: Secondary | ICD-10-CM | POA: Diagnosis not present

## 2020-07-23 DIAGNOSIS — M25561 Pain in right knee: Secondary | ICD-10-CM

## 2020-07-23 DIAGNOSIS — M222X1 Patellofemoral disorders, right knee: Secondary | ICD-10-CM | POA: Diagnosis not present

## 2020-07-23 DIAGNOSIS — R1030 Lower abdominal pain, unspecified: Secondary | ICD-10-CM | POA: Diagnosis not present

## 2020-07-23 DIAGNOSIS — R1031 Right lower quadrant pain: Secondary | ICD-10-CM | POA: Diagnosis not present

## 2020-07-23 MED ORDER — VITAMIN D (ERGOCALCIFEROL) 1.25 MG (50000 UNIT) PO CAPS: 50000.0000 [IU] | ORAL_CAPSULE | ORAL | 0 refills | Status: DC

## 2020-07-23 NOTE — Patient Instructions (Addendum)
Good to see you Think it is more plica syndrome but noticed inflammation Groin exercises Injection today Once weekly vitamin D Thigh compression sleeve with any working out [email or web address] Ice after activity Pelvis xray See me again in 6 weeks

## 2020-07-23 NOTE — Assessment & Plan Note (Signed)
Patient has pain more consistent with an abductor muscle group injury.  Questionable avulsion fracture.  X-rays are pending.  Started once weekly vitamin D, discussed thigh compression, home exercises, icing regimen.  Patient will increase activity slowly and follow-up with me again 6 weeks

## 2020-07-23 NOTE — Progress Notes (Signed)
[facility] [address] [place] [address] Phone: [phone] Subjective:   I Peter Erickson am serving as a Neurosurgeon for Dr. Antoine Primas.  This visit occurred during the SARS-CoV-2 public health emergency.  Safety protocols were in place, including screening questions prior to the visit, additional usage of staff PPE, and extensive cleaning of exam room while observing appropriate contact time as indicated for disinfecting solutions.   I'm seeing this patient by the request  of:  Peter Mylar, MD  CC: knee pain follow up   FKC:[id number]  Peter Erickson is a 45 y.o. male coming in with complaint of right knee pain. States he started running during covid. Went to PT and thought he was getting better but still has issues with running. 2019 pulled his groin. Certain poses in yoga causes pain. Was doing yoga when he injured groin. Medial knee pain. States the quad muscle will feel sore at times. 4/10 at its worse. Hasn't noticed any swelling. Not sure if he fully recovered from the groin injury and states he got back to about 80-90%.    MRI of the right knee taking August 2021 showed very minimal degenerative changes of the cartilage noted to the medial compartment and questionable mild focal synovitis of the distal quadricep.  Otherwise completely unremarkable.  No past medical history on file. No past surgical history on file. Social History   Socioeconomic History  . Marital status: Married    Spouse name: Not on file  . Number of children: Not on file  . Years of education: Not on file  . Highest education level: Not on file  Occupational History  . Not on file  Tobacco Use  . Smoking status: Never Smoker  . Smokeless tobacco: Never Used  Substance and Sexual Activity  . Alcohol use: Not on file  . Drug use: Not on file  . Sexual activity: Not on file  Other Topics Concern  . Not on file  Social History Narrative  . Not on file    Social Determinants of Health   Financial Resource Strain: Not on file  Food Insecurity: Not on file  Transportation Needs: Not on file  Physical Activity: Not on file  Stress: Not on file  Social Connections: Not on file   No Known Allergies No family history on file.   Current Outpatient Medications (Cardiovascular):  .  nitroGLYCERIN (NITRODUR - DOSED IN MG/24 HR) 0.2 mg/hr patch, Apply 1/4 patch daily to medial knee for tendonitis.  Current Outpatient Medications (Respiratory):  .  fexofenadine (ALLEGRA) 180 MG tablet, Take 180 mg by mouth daily.    Current Outpatient Medications (Other):  Marland Kitchen  Vitamin D, Ergocalciferol, (DRISDOL) 1.25 MG (50000 UNIT) CAPS capsule, Take 1 capsule (50,000 Units total) by mouth every 7 (seven) days.   Reviewed prior external information including notes and imaging from  primary care provider As well as notes that were available from care everywhere and other healthcare systems.  Past medical history, social, surgical and family history all reviewed in electronic medical record.  No pertanent information unless stated regarding to the chief complaint.   Review of Systems:  No headache, visual changes, nausea, vomiting, diarrhea, constipation, dizziness, abdominal pain, skin rash, fevers, chills, night sweats, weight loss, swollen lymph nodes, body aches, joint swelling, chest pain, shortness of breath, mood changes. POSITIVE muscle aches  Objective  Blood pressure 130/86, pulse (!) 59, height 5\' 10"  (1.778 m), weight 174 lb (78.9 kg),  SpO2 100 %.   General: No apparent distress alert and oriented x3 mood and affect normal, dressed appropriately.  HEENT: Pupils equal, extraocular movements intact  Respiratory: Patient's speak in full sentences and does not appear short of breath  Cardiovascular: No lower extremity edema, non tender, no erythema  Gait normal with good balance and coordination.  MSK:   Patient's right knee does have some  tenderness to palpation over the medial joint line.  Good range of motion.  Possible plica noted on the superior medial aspect of the patella.  Mild patellar grind test noted.  Right hip has fairly good range of motion.  Limits a little bit of the last 5 degrees of internal rotation.  Patient has some very mild pain in the inferior rami at the origin of the abductor muscle group.  Patient does not have pain with resisted adduction of the leg.  Limited musculoskeletal ultrasound was performed and interpreted by Judi Saa . Limited ultrasound of patient's right knee shows the patient does have a trace effusion noted at the moment.  Patient does also have what appears to be a plica noted of the medial aspect of the knee.   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 Right knee was prepped with alcohol swab and utilizing anterolateral approach, patient's right knee space was injected with 4:1  marcaine 0.5%: Kenalog 40mg /dL. Patient tolerated the procedure well without immediate complications.   Impression and Recommendations:     The above documentation has been reviewed and is accurate and complete , DO

## 2020-07-23 NOTE — Assessment & Plan Note (Signed)
Patient given injection and tolerated the procedure well.  I do believe though the patient may have a potential plica that was not seen on the MRI.  We will continue to monitor and may need to consider possible injection of this as well.  Discussed potential bracing, icing regimen, avoiding certain activities initially.  We will start once weekly vitamin D as well.  Follow-up again in 6 weeks

## 2020-09-14 ENCOUNTER — Ambulatory Visit: Payer: BC Managed Care – PPO | Admitting: Family Medicine

## 2020-09-14 NOTE — Progress Notes (Signed)
[name] [address] [place] [address] Phone: [phone] Subjective:   Peter Erickson, am serving as a scribe for Dr. Antoine Primas. This visit occurred during the SARS-CoV-2 public health emergency.  Safety protocols were in place, including screening questions prior to the visit, additional usage of staff PPE, and extensive cleaning of exam room while observing appropriate contact time as indicated for disinfecting solutions.   I'm seeing this patient by the request  of:  Wilburn Mylar, MD  CC: Knee pain  [id number]  07/23/2020 Patient has pain more consistent with an abductor muscle group injury.  Questionable avulsion fracture.  X-rays are pending.  Started once weekly vitamin D, discussed thigh compression, home exercises, icing regimen.  Patient will increase activity slowly and follow-up with me again 6 weeks  Patient given injection and tolerated the procedure well.  I do believe though the patient may have a potential plica that was not seen on the MRI.  We will continue to monitor and may need to consider possible injection of this as well.  Discussed potential bracing, icing regimen, avoiding certain activities initially.  We will start once weekly vitamin D as well.  Follow-up again in 6 weeks  Update 09/15/2020 Peter Erickson is a 45 y.o. male coming in with complaint of R knee pain and R groin pain. Patient states that he has felt some improvement. Is using thigh compression sleeve and he felt like this caused his patella to be "off". Pain is still medial or over anterior patella.  Patient states he does not know how much better he is.  Would state that he is better but he has not been able to push it yet at this moment.     No past medical history on file. No past surgical history on file. Social History   Socioeconomic History   Marital status: Married    Spouse name: Not on file   Number of children: Not on file   Years  of education: Not on file   Highest education level: Not on file  Occupational History   Not on file  Tobacco Use   Smoking status: Never   Smokeless tobacco: Never  Substance and Sexual Activity   Alcohol use: Not on file   Drug use: Not on file   Sexual activity: Not on file  Other Topics Concern   Not on file  Social History Narrative   Not on file   Social Determinants of Health   Financial Resource Strain: Not on file  Food Insecurity: Not on file  Transportation Needs: Not on file  Physical Activity: Not on file  Stress: Not on file  Social Connections: Not on file   No Known Allergies No family history on file.   Current Outpatient Medications (Cardiovascular):    nitroGLYCERIN (NITRODUR - DOSED IN MG/24 HR) 0.2 mg/hr patch, Apply 1/4 patch daily to medial knee for tendonitis.  Current Outpatient Medications (Respiratory):    fexofenadine (ALLEGRA) 180 MG tablet, Take 180 mg by mouth daily.    Current Outpatient Medications (Other):    Vitamin D, Ergocalciferol, (DRISDOL) 1.25 MG (50000 UNIT) CAPS capsule, Take 1 capsule (50,000 Units total) by mouth every 7 (seven) days.   Reviewed prior external information including notes and imaging from  primary care provider As well as notes that were available from care everywhere and other healthcare systems.  Past medical history, social, surgical and family history all reviewed in electronic medical record.  No pertanent information unless stated regarding to the chief complaint.   Review of Systems:  No headache, visual changes, nausea, vomiting, diarrhea, constipation, dizziness, abdominal pain, skin rash, fevers, chills, night sweats, weight loss, swollen lymph nodes, body aches, joint swelling, chest pain, shortness of breath, mood changes. POSITIVE muscle aches  Objective  Blood pressure 120/88, pulse (!) 57, height 5\' 10"  (1.778 m), weight 174 lb (78.9 kg), SpO2 99 %.   General: No apparent distress alert  and oriented x3 mood and affect normal, dressed appropriately.  HEENT: Pupils equal, extraocular movements intact  Respiratory: Patient's speak in full sentences and does not appear short of breath  Cardiovascular: No lower extremity edema, non tender, no erythema  right knee exam shows mild lateral tracking of the patella noted.  Very mild crepitus noted.  Still has some tenderness over the medial joint line as well as over the.  Medial aspect of the knee that does seem to be consistent with a possible plica.  Patient has good range of motion of the knee.  Mild discomfort though is noted with McMurray's.  Limited musculoskeletal ultrasound was performed and interpreted by  Limited ultrasound does not show any significant hypoechoic changes in the patellofemoral joint space at this time.  The patient still has some thickening of the plica noted.  He does have an abnormality of some mild hypoechoic changes over the superior medial aspect of the patella.  No abnormal blood flow.   Impression and Recommendations:     The above documentation has been reviewed and is accurate and complete Judi Saa, DO

## 2020-09-15 ENCOUNTER — Encounter: Payer: Self-pay | Admitting: Family Medicine

## 2020-09-15 ENCOUNTER — Other Ambulatory Visit: Payer: Self-pay

## 2020-09-15 ENCOUNTER — Ambulatory Visit: Payer: BC Managed Care – PPO | Admitting: Family Medicine

## 2020-09-15 DIAGNOSIS — M222X1 Patellofemoral disorders, right knee: Secondary | ICD-10-CM

## 2020-09-15 NOTE — Patient Instructions (Signed)
Great to see you New brace with the knmee Ok toincrease activity in the brace Continue vitamin d and ice See me again in 6-8 weeks

## 2020-09-15 NOTE — Assessment & Plan Note (Signed)
Patient continues to have the patellofemoral pain.  Given a Tru pull lite brace.  Patient can discontinue the nitroglycerin.  Continue vitamin D.  Discussed icing regimen and home exercises.  Patient on quick ultrasound still has what appears to be of mild medial plica pain if any inflammation at follow-up consider potential injection.  Follow-up again in 6 to 8 weeks

## 2020-10-21 ENCOUNTER — Ambulatory Visit: Payer: BC Managed Care – PPO | Admitting: Family Medicine

## 2020-10-26 ENCOUNTER — Ambulatory Visit: Payer: BC Managed Care – PPO | Admitting: Family Medicine

## 2020-11-04 NOTE — Progress Notes (Signed)
[name] [address] [place] [address] Phone: [phone] Subjective:    I'm seeing this patient by the request  of:  Wilburn Mylar, MD  CC: R Knee pain follow-up  I, [name], LAT, ATC, am serving as scribe for Dr. Antoine Primas.  [id number]  09/15/2020 Patient continues to have the patellofemoral pain.  Given a Tru pull lite brace.  Patient can discontinue the nitroglycerin.  Continue vitamin D.  Discussed icing regimen and home exercises.  Patient on quick ultrasound still has what appears to be of mild medial plica pain if any inflammation at follow-up consider potential injection.  Follow-up again in 6 to 8 weeks  Update 11/05/2020 Arvon Schreiner is a 45 y.o. male coming in with complaint of R knee patellofemoral pain. Patient states that the brace has been "perfect" and has been able to do multiple runs per week about 2-3 miles/run.  He states that his R knee started bothering him more over the last few weeks after he moved and was going up and down a lot of stairs w/o the brace.  However, today he is feeling good.  He is no longer using the vit D.      No past medical history on file. No past surgical history on file. Social History   Socioeconomic History   Marital status: Married    Spouse name: Not on file   Number of children: Not on file   Years of education: Not on file   Highest education level: Not on file  Occupational History   Not on file  Tobacco Use   Smoking status: Never   Smokeless tobacco: Never  Substance and Sexual Activity   Alcohol use: Not on file   Drug use: Not on file   Sexual activity: Not on file  Other Topics Concern   Not on file  Social History Narrative   Not on file   Social Determinants of Health   Financial Resource Strain: Not on file  Food Insecurity: Not on file  Transportation Needs: Not on file  Physical Activity: Not on file  Stress: Not on file  Social Connections: Not  on file   No Known Allergies No family history on file.    Current Outpatient Medications (Respiratory):    fexofenadine (ALLEGRA) 180 MG tablet, Take 180 mg by mouth daily.    Current Outpatient Medications (Other):    Vitamin D, Ergocalciferol, (DRISDOL) 1.25 MG (50000 UNIT) CAPS capsule, Take 1 capsule (50,000 Units total) by mouth every 7 (seven) days. (Patient not taking: Reported on 11/05/2020)   Reviewed prior external information including notes and imaging from  primary care provider As well as notes that were available from care everywhere and other healthcare systems.  Past medical history, social, surgical and family history all reviewed in electronic medical record.  No pertanent information unless stated regarding to the chief complaint.   Review of Systems:  No headache, visual changes, nausea, vomiting, diarrhea, constipation, dizziness, abdominal pain, skin rash, fevers, chills, night sweats, weight loss, swollen lymph nodes, body aches, joint swelling, chest pain, shortness of breath, mood changes. POSITIVE muscle aches  Objective  Blood pressure 120/88, pulse 64, height 5\' 10"  (1.778 m), weight 172 lb 12.8 oz (78.4 kg), SpO2 99 %.   General: No apparent distress alert and oriented x3 mood and affect normal, dressed appropriately.  HEENT: Pupils equal, extraocular movements intact  Respiratory: Patient's speak in full sentences and does not appear  short of breath  Cardiovascular: No lower extremity edema, non tender, no erythema  Gait normal with good balance and coordination.  MSK: Knee exam shows patient is doing much better.  No significant swelling.  Still some mild pain over the superior medial aspect of the knee.  Does feel there is a potential for plica.  Negative McMurray's.  Full range of motion.  Limited muscular skeletal ultrasound was performed and interpreted by Antoine Primas, M  Limited ultrasound of patient's knee is unremarkable except for very  mild hypoechoic changes of what appears to be a plica of the superior medial aspect.  Patient patellofemoral has no significant swelling noted.    Impression and Recommendations:     The above documentation has been reviewed and is accurate and complete Judi Saa, DO

## 2020-11-05 ENCOUNTER — Ambulatory Visit: Payer: Self-pay

## 2020-11-05 ENCOUNTER — Encounter: Payer: Self-pay | Admitting: Family Medicine

## 2020-11-05 ENCOUNTER — Other Ambulatory Visit: Payer: Self-pay

## 2020-11-05 ENCOUNTER — Ambulatory Visit: Payer: BC Managed Care – PPO | Admitting: Family Medicine

## 2020-11-05 VITALS — BP 120/88 | HR 64 | Ht 70.0 in | Wt 172.8 lb

## 2020-11-05 DIAGNOSIS — M222X1 Patellofemoral disorders, right knee: Secondary | ICD-10-CM | POA: Diagnosis not present

## 2020-11-05 NOTE — Assessment & Plan Note (Signed)
Patient did very well with the injection previously.  We discussed the potential for the plica as well.  Patient is wearing the brace.  Encouraged him to continue to do that for certain activities as well as anti-inflammatories and burst.  Discussed icing protocol.  Follow-up with me as needed.

## 2020-11-05 NOTE — Patient Instructions (Addendum)
Good to see you.   Heat before and ice after activity  3 IBU 3x/day for 3 days if knee flares up.  Wear brace for a lot of activity but don't need to be dependent on it.   Follow-up as needed.

## 2020-11-16 ENCOUNTER — Ambulatory Visit: Payer: BC Managed Care – PPO | Admitting: Family Medicine

## 2020-12-02 ENCOUNTER — Ambulatory Visit: Payer: BC Managed Care – PPO | Admitting: Internal Medicine

## 2020-12-02 ENCOUNTER — Other Ambulatory Visit: Payer: Self-pay

## 2020-12-02 ENCOUNTER — Encounter: Payer: Self-pay | Admitting: Internal Medicine

## 2020-12-02 VITALS — BP 122/84 | HR 70 | Temp 98.1°F | Ht 70.0 in | Wt 174.0 lb

## 2020-12-02 DIAGNOSIS — N5089 Other specified disorders of the male genital organs: Secondary | ICD-10-CM | POA: Insufficient documentation

## 2020-12-02 DIAGNOSIS — Z23 Encounter for immunization: Secondary | ICD-10-CM | POA: Diagnosis not present

## 2020-12-02 DIAGNOSIS — Z0001 Encounter for general adult medical examination with abnormal findings: Secondary | ICD-10-CM

## 2020-12-02 DIAGNOSIS — Z1159 Encounter for screening for other viral diseases: Secondary | ICD-10-CM | POA: Insufficient documentation

## 2020-12-02 LAB — HEPATIC FUNCTION PANEL
ALT: 14 U/L (ref 0–53)
AST: 19 U/L (ref 0–37)
Albumin: 4.7 g/dL (ref 3.5–5.2)
Alkaline Phosphatase: 49 U/L (ref 39–117)
Bilirubin, Direct: 0.1 mg/dL (ref 0.0–0.3)
Total Bilirubin: 0.5 mg/dL (ref 0.2–1.2)
Total Protein: 7.6 g/dL (ref 6.0–8.3)

## 2020-12-02 LAB — URINALYSIS, ROUTINE W REFLEX MICROSCOPIC
Bilirubin Urine: NEGATIVE
Hgb urine dipstick: NEGATIVE
Ketones, ur: NEGATIVE
Leukocytes,Ua: NEGATIVE
Nitrite: NEGATIVE
Specific Gravity, Urine: 1.02 (ref 1.000–1.030)
Total Protein, Urine: NEGATIVE
Urine Glucose: NEGATIVE
Urobilinogen, UA: 0.2 (ref 0.0–1.0)
pH: 6 (ref 5.0–8.0)

## 2020-12-02 LAB — CBC WITH DIFFERENTIAL/PLATELET
Basophils Absolute: 0.1 10*3/uL (ref 0.0–0.1)
Basophils Relative: 1.1 % (ref 0.0–3.0)
Eosinophils Absolute: 0.4 10*3/uL (ref 0.0–0.7)
Eosinophils Relative: 7.4 % — ABNORMAL HIGH (ref 0.0–5.0)
HCT: 41.6 % (ref 39.0–52.0)
Hemoglobin: 14.3 g/dL (ref 13.0–17.0)
Lymphocytes Relative: 38.8 % (ref 12.0–46.0)
Lymphs Abs: 2 10*3/uL (ref 0.7–4.0)
MCHC: 34.3 g/dL (ref 30.0–36.0)
MCV: 89.7 fl (ref 78.0–100.0)
Monocytes Absolute: 0.5 10*3/uL (ref 0.1–1.0)
Monocytes Relative: 8.9 % (ref 3.0–12.0)
Neutro Abs: 2.3 10*3/uL (ref 1.4–7.7)
Neutrophils Relative %: 43.8 % (ref 43.0–77.0)
Platelets: 205 10*3/uL (ref 150.0–400.0)
RBC: 4.64 Mil/uL (ref 4.22–5.81)
RDW: 12.8 % (ref 11.5–15.5)
WBC: 5.2 10*3/uL (ref 4.0–10.5)

## 2020-12-02 LAB — LIPID PANEL
Cholesterol: 177 mg/dL (ref 0–200)
HDL: 57.3 mg/dL (ref >39.00)
LDL Cholesterol: 100 mg/dL — ABNORMAL HIGH (ref 0–99)
NonHDL: 119.93
Total CHOL/HDL Ratio: 3
Triglycerides: 98 mg/dL (ref 0.0–149.0)
VLDL: 19.6 mg/dL (ref 0.0–40.0)

## 2020-12-02 NOTE — Progress Notes (Signed)
Subjective:  Patient ID: Peter Erickson, male    DOB: 01/06/1976  Age: 45 y.o. MRN: [id number]  CC: Annual Exam  This visit occurred during the SARS-CoV-2 public health emergency.  Safety protocols were in place, including screening questions prior to the visit, additional usage of staff PPE, and extensive cleaning of exam room while observing appropriate contact time as indicated for disinfecting solutions.    HPI Peter Erickson presents for a CPX and to establish.  He complains of a 2 month hx of right scrotal pain and swelling. He saw sports med about 4 months ago for right knee/groin pain.  History Peter Erickson has no past medical history on file.   He has no past surgical history on file.   His family history includes Cervical cancer in his mother.He reports that he has never smoked. He has never used smokeless tobacco. He reports that he does not currently use alcohol. He reports that he does not use drugs.  Outpatient Medications Prior to Visit  Medication Sig Dispense Refill   fexofenadine (ALLEGRA) 180 MG tablet Take 180 mg by mouth daily.     Vitamin D, Ergocalciferol, (DRISDOL) 1.25 MG (50000 UNIT) CAPS capsule Take 1 capsule (50,000 Units total) by mouth every 7 (seven) days. (Patient not taking: Reported on 11/05/2020) 12 capsule 0   No facility-administered medications prior to visit.    ROS Review of Systems  Constitutional:  Negative for chills, diaphoresis, fatigue and fever.  HENT: Negative.  Negative for sore throat.   Eyes: Negative.   Respiratory:  Negative for cough, chest tightness, shortness of breath and wheezing.   Cardiovascular:  Negative for chest pain, palpitations and leg swelling.  Gastrointestinal:  Negative for abdominal pain, blood in stool, constipation, diarrhea and vomiting.  Endocrine: Negative.   Genitourinary:  Positive for scrotal swelling and testicular pain. Negative for decreased urine volume, difficulty urinating, dysuria, frequency, genital  sores, hematuria, penile discharge, penile pain, penile swelling and urgency.  Musculoskeletal: Negative.  Negative for arthralgias.  Skin: Negative.  Negative for color change.  Neurological: Negative.  Negative for dizziness, weakness, light-headedness and headaches.  Hematological:  Negative for adenopathy. Does not bruise/bleed easily.  Psychiatric/Behavioral: Negative.     Objective:  BP 122/84 (BP Location: Left Arm, Patient Position: Sitting, Cuff Size: Large)   Pulse 70   Temp 98.1 F (36.7 C) (Oral)   Ht 5\' 10"  (1.778 m)   Wt 174 lb (78.9 kg)   SpO2 98%   BMI 24.97 kg/m   Physical Exam Vitals reviewed.  Constitutional:      Appearance: Normal appearance.  HENT:     Nose: Nose normal.     Mouth/Throat:     Mouth: Mucous membranes are moist.  Eyes:     General: No scleral icterus.    Conjunctiva/sclera: Conjunctivae normal.  Cardiovascular:     Rate and Rhythm: Normal rate and regular rhythm.     Heart sounds: No murmur heard. Pulmonary:     Effort: Pulmonary effort is normal.     Breath sounds: No stridor. No wheezing, rhonchi or rales.  Abdominal:     General: Abdomen is flat.     Palpations: There is no mass.     Tenderness: There is no abdominal tenderness. There is no guarding or rebound.     Hernia: No hernia is present.  Genitourinary:    Pubic Area: No rash.      Penis: Normal and circumcised.      Testes:  Right: Mass, tenderness, swelling, testicular hydrocele or varicocele not present.        Left: Testicular hydrocele present. Mass, tenderness, swelling or varicocele not present.     Epididymis:     Right: Inflamed and enlarged. Mass present.     Left: Normal.     Comments: Right head of epididymis is enlarged with a mass-like feeling with mild ttp and swelling Musculoskeletal:        General: Normal range of motion.     Cervical back: Neck supple.     Right lower leg: No edema.     Left lower leg: No edema.  Lymphadenopathy:      Cervical: No cervical adenopathy.  Skin:    General: Skin is warm and dry.     Coloration: Skin is not pale.  Neurological:     General: No focal deficit present.     Mental Status: He is alert.  Psychiatric:        Mood and Affect: Mood normal.        Behavior: Behavior normal.    Lab Results  Component Value Date   WBC 5.2 12/02/2020   HGB 14.3 12/02/2020   HCT 41.6 12/02/2020   PLT 205.0 12/02/2020   GLUCOSE 93 11/14/2019   CHOL 177 12/02/2020   TRIG 98.0 12/02/2020   HDL 57.30 12/02/2020   LDLCALC 100 (H) 12/02/2020   ALT 14 12/02/2020   AST 19 12/02/2020   NA 139 11/14/2019   K 4.5 11/14/2019   CL 102 11/14/2019   CREATININE 1.00 11/14/2019   BUN 16 11/14/2019   CO2 32 11/14/2019     Assessment & Plan:   Peter Erickson was seen today for annual exam.  Diagnoses and all orders for this visit:  Scrotal mass- His labs are reassuring.  I recommended that he undergo an Ultrasound with Doppler to screen for abscess/malignancy. -     Cancel: US SCROTUM; Future -     CBC with Differential/Platelet; Future -     Urinalysis, Routine w reflex microscopic; Future -     Hepatic function panel; Future -     Hepatic function panel -     Urinalysis, Routine w reflex microscopic -     CBC with Differential/Platelet -     US SCROTUM W/DOPPLER; Future  Encounter for general adult medical examination with abnormal findings- Exam completed, labs reviewed, vaccines reviewed and updated, no cancer screenings are indicated, patient education was given. -     Lipid panel; Future -     HIV Antibody (routine testing w rflx); Future -     Hepatitis C antibody; Future -     Hepatitis C antibody -     HIV Antibody (routine testing w rflx) -     Lipid panel  Need for hepatitis C screening test -     Hepatitis C antibody; Future -     Hepatitis C antibody  Other orders -     Tdap vaccine greater than or equal to 7yo IM  I have discontinued Peter Erickson's Vitamin D (Ergocalciferol). I am  also having him maintain his fexofenadine.  No orders of the defined types were placed in this encounter.    Follow-up: Return in about 4 weeks (around 12/30/2020).  Sanda Linger, MD

## 2020-12-02 NOTE — Patient Instructions (Signed)
Scrotal Swelling Scrotal swelling is a condition in which the sac of skin that contains the testicles, blood vessels, and structures that help deliver sperm and semen (scrotum) is enlarged or swollen. This can happen on one or both sides of the scrotum. Many things can cause the scrotum to enlarge or swell, including: Fluid around the testicle (hydrocele). A weakened area in the muscles around the groin (hernia). An enlarged vein around the testicle. An injury. An infection. Certain medical treatments. Certain medical conditions, such as congestive heart failure. A recent genital surgery or procedure. A twisting of the spermatic cord that cuts off blood supply (testicular torsion). Testicular cancer. Scrotal swelling can happen along with scrotal pain. Follow these instructions at home: Activity Rest as told by your health care provider. The best position is to lie down. Do not lift anything that is heavier than 5 lb (2.3 kg), or the limit that you are told, until your health care provider says that it is safe. Avoid sexual activity until your health care provider says that it is safe. General instructions Take over-the-counter and prescription medicines only as told by your health care provider. Perform a monthly self-exam of the scrotum and penis. Feel for changes. Ask your health care provider how to perform a monthly self-exam if you are unsure. Keep all follow-up visits. This is important. Managing pain, stiffness, and swelling If directed, put ice on the affected area. To do this: Put ice in a plastic bag. Place a towel between your skin and the bag. Leave the ice on for 20 minutes, 2-3 times a day. Remove the ice if your skin turns bright red. This is very important. If you cannot feel pain, heat, or cold, you have a greater risk of damage to the area. Place a rolled towel under your testicles for support or use underwear with a supportive pouch. Wear an athletic support cup or  scrotal support, such as a jock strap, for comfort. Contact a health care provider if: You have sudden pain that is persistent and does not improve. You have a heavy feeling or notice fluid in the scrotum. You have pain or burning while urinating. You have blood in your urine or semen. You feel a lump around the testicle. You notice that one testicle is larger than the other. Keep in mind that a small difference in size is normal. You have a persistent dull ache or pain in your groin or scrotum. Get help right away if: The pain does not go away. The pain becomes severe. You have a fever or chills. You have pain or vomiting that cannot be controlled. One or both sides of the scrotum are very red and swollen. There is redness spreading upward from your scrotum to your abdomen or downward from your scrotum to your thighs. Summary Scrotal swelling is a condition in which the sac of skin that contains the testicles, blood vessels, and structures that help deliver the sperm and semen (scrotum) is enlarged or swollen. Many things can cause the scrotum to swell, including fluid around the testicle (hydrocele), a weakened area in the muscles around the groin (hernia), and an enlarged vein around the testicle. Icing the scrotum or using underwear with a supportive pouch may help reduce swelling and pain. Contact a health care provider if you develop scrotal pain that is sudden and persistent, you have pain while urinating, you feel a lump around the testicle, or you notice blood in your urine or semen. Get help right away   if you have uncontrolled pain or vomiting, a very red and swollen scrotum, or a fever or chills. This information is not intended to replace advice given to you by your health care provider. Make sure you discuss any questions you have with your health care provider. Document Revised: 11/04/2019 Document Reviewed: 11/04/2019 Elsevier Patient Education  2022 [employer]  

## 2020-12-03 ENCOUNTER — Telehealth: Payer: Self-pay | Admitting: Internal Medicine

## 2020-12-03 LAB — HEPATITIS C ANTIBODY
Hepatitis C Ab: NONREACTIVE
SIGNAL TO CUT-OFF: 0.01 (ref <1.00)

## 2020-12-03 LAB — HIV ANTIBODY (ROUTINE TESTING W REFLEX): HIV 1&2 Ab, 4th Generation: NONREACTIVE

## 2020-12-03 NOTE — Telephone Encounter (Signed)
Joni Reining from [facility] called   Requesting clarification for the scrotum US. Should it be with or without the doppler?   Please advise.   Best callback#: [phone]

## 2020-12-03 NOTE — Telephone Encounter (Signed)
Per verbal conversation with PCP [place] should be "with doppler".   I have informed Joni Reining and she stated that she would update the order.

## 2020-12-07 ENCOUNTER — Ambulatory Visit
Admission: RE | Admit: 2020-12-07 | Discharge: 2020-12-07 | Disposition: A | Discharge status: Home / Self Care | Payer: BC Managed Care – PPO | Source: Ambulatory Visit | Attending: Internal Medicine | Admitting: Internal Medicine

## 2020-12-07 DIAGNOSIS — N5089 Other specified disorders of the male genital organs: Secondary | ICD-10-CM

## 2020-12-09 ENCOUNTER — Other Ambulatory Visit: Payer: Self-pay | Admitting: Internal Medicine

## 2020-12-09 DIAGNOSIS — N451 Epididymitis: Secondary | ICD-10-CM | POA: Insufficient documentation

## 2020-12-09 MED ORDER — SULFAMETHOXAZOLE-TRIMETHOPRIM 800-160 MG PO TABS: 1.0000 | ORAL_TABLET | Freq: Two times a day (BID) | ORAL | 0 refills | Status: AC

## 2020-12-21 ENCOUNTER — Ambulatory Visit: Payer: BC Managed Care – PPO | Admitting: Medical

## 2021-10-11 DIAGNOSIS — L812 Freckles: Secondary | ICD-10-CM | POA: Diagnosis not present

## 2021-10-11 DIAGNOSIS — D225 Melanocytic nevi of trunk: Secondary | ICD-10-CM | POA: Diagnosis not present

## 2021-10-11 DIAGNOSIS — L738 Other specified follicular disorders: Secondary | ICD-10-CM | POA: Diagnosis not present

## 2021-10-11 DIAGNOSIS — L218 Other seborrheic dermatitis: Secondary | ICD-10-CM | POA: Diagnosis not present

## 2022-01-21 IMAGING — DX DG KNEE AP/LAT W/ SUNRISE*R*
4 series · 4 of 4 positions shown · non-contrast
Comparison: None.

CLINICAL DATA: Chronic pain

EXAM:
RIGHT KNEE 3 VIEWS

[knee ap]
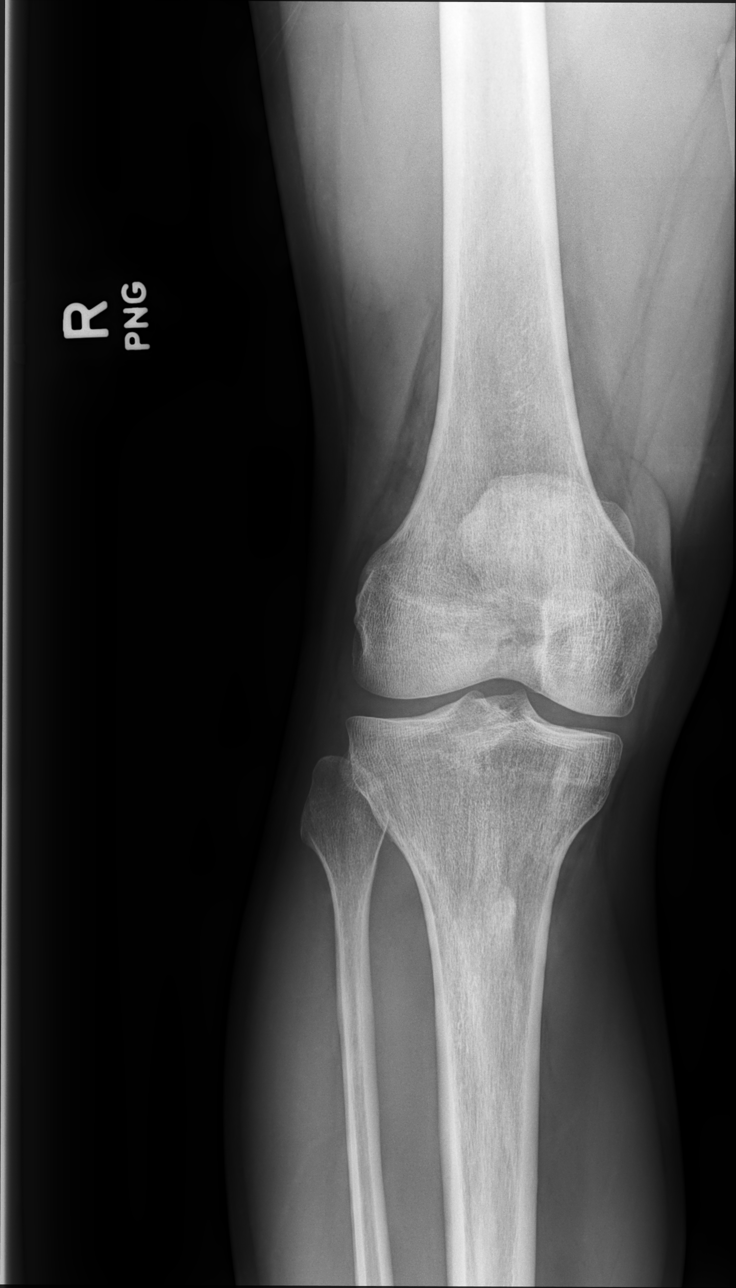

[knee lat]
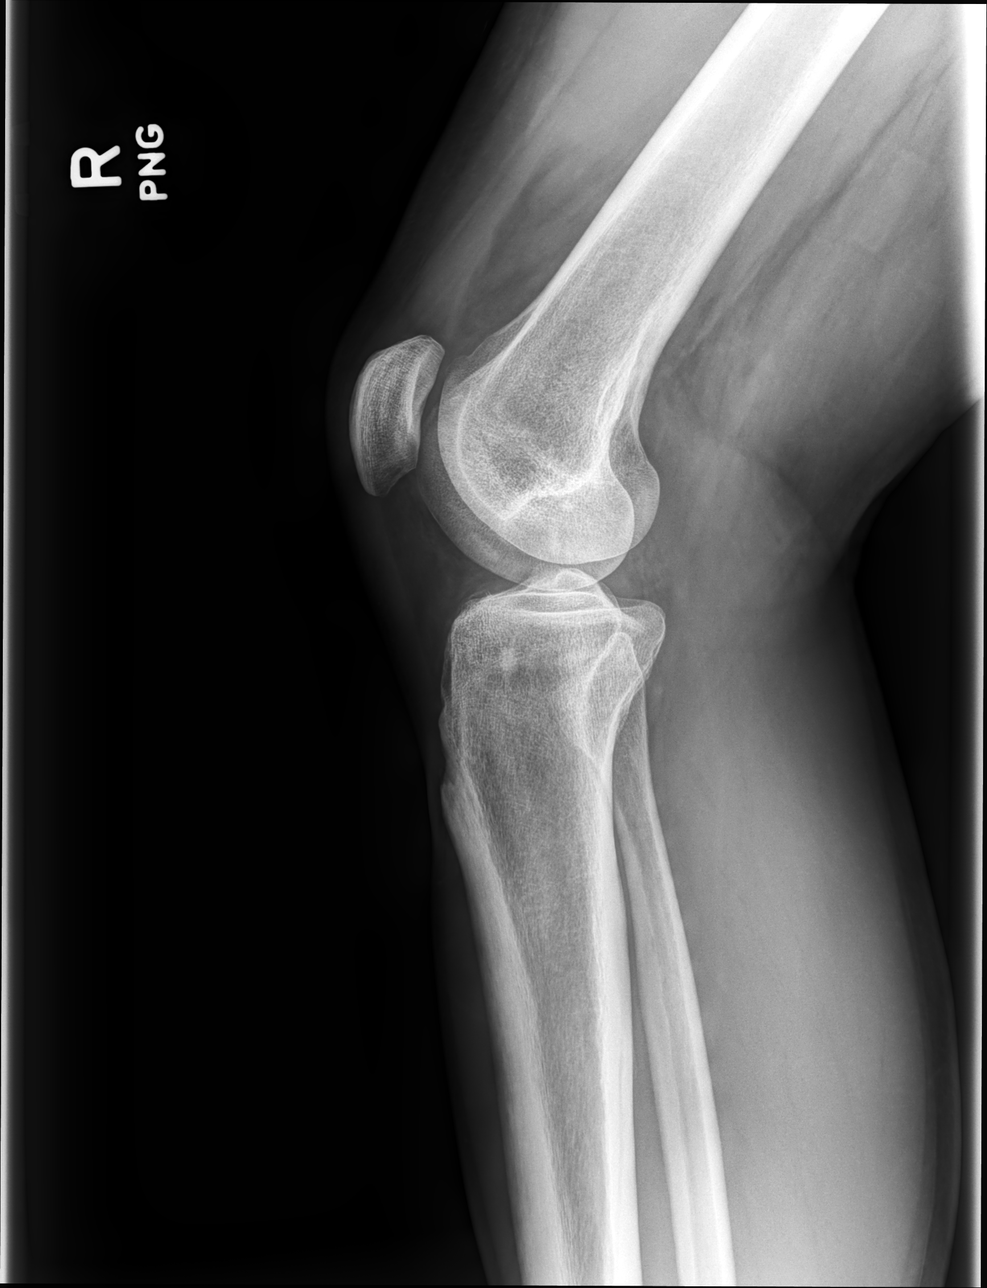

[knee (tunnel view) pa]
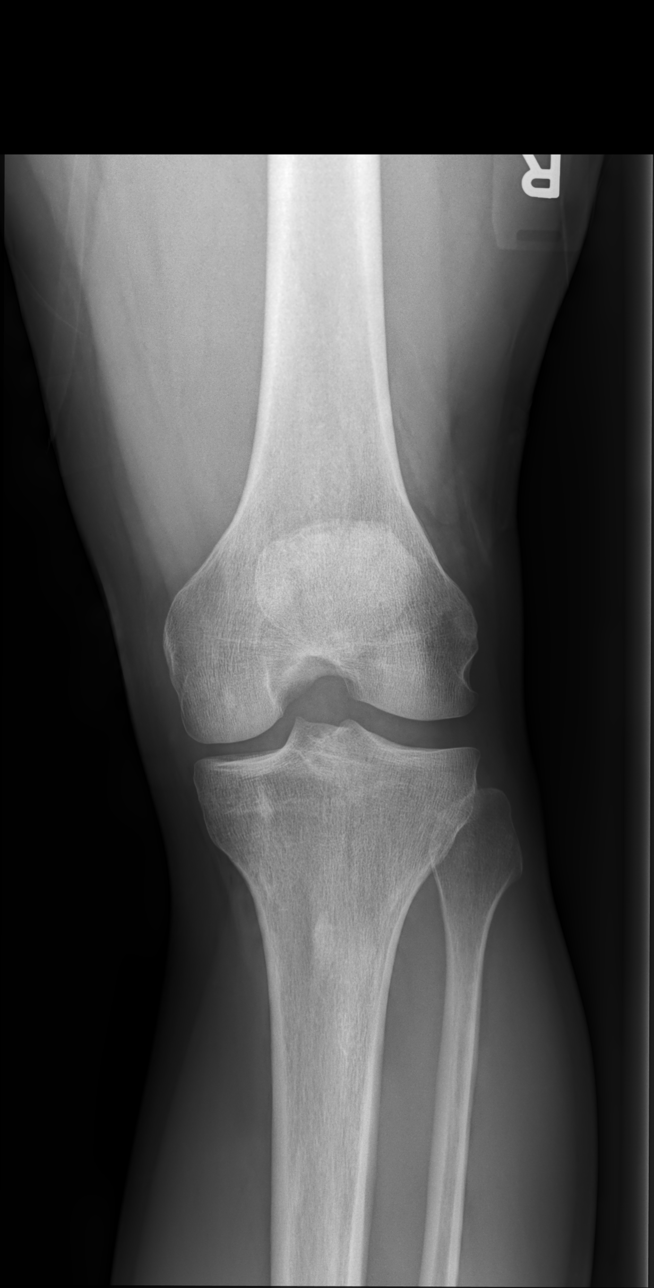

[patella (sunrise) tan]
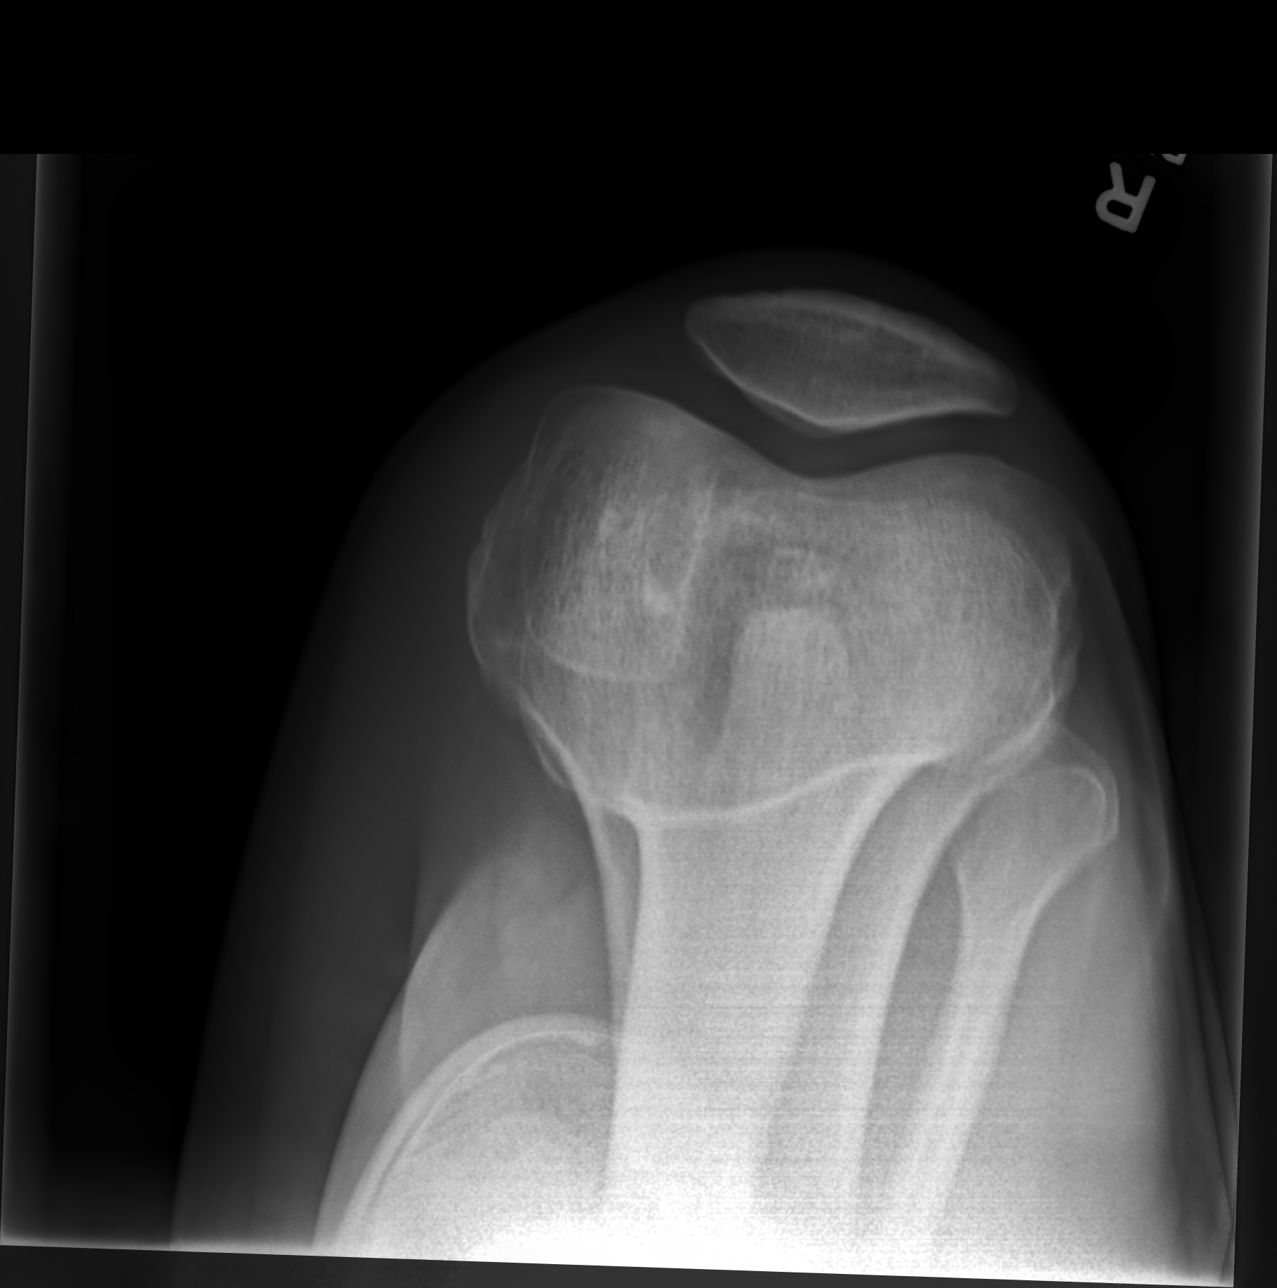

[4 of 4 positions shown; findings below may reference images not displayed]

FINDINGS: Frontal, lateral, tunnel, and sunrise patellar images were obtained.
No fracture or dislocation. No joint effusion. There is minimal
narrowing medially. Other joint spaces appear normal. No erosive
change or intra-articular calcification.
IMPRESSION: Rather minimal narrowing medially. Joint spaces otherwise appear
normal. No fracture, dislocation, or effusion.

## 2022-01-29 DIAGNOSIS — N451 Epididymitis: Secondary | ICD-10-CM | POA: Diagnosis not present

## 2022-03-01 ENCOUNTER — Encounter: Payer: Self-pay | Admitting: Internal Medicine

## 2022-03-01 ENCOUNTER — Ambulatory Visit (INDEPENDENT_AMBULATORY_CARE_PROVIDER_SITE_OTHER): Payer: BC Managed Care – PPO | Admitting: Internal Medicine

## 2022-03-01 VITALS — BP 118/76 | HR 94 | Temp 98.1°F | Ht 70.0 in | Wt 183.0 lb

## 2022-03-01 DIAGNOSIS — Z0000 Encounter for general adult medical examination without abnormal findings: Secondary | ICD-10-CM | POA: Diagnosis not present

## 2022-03-01 NOTE — Progress Notes (Signed)
Subjective:  Patient ID: Peter Erickson, male    DOB: [birth year]  Age: 46 y.o. MRN: [id number]  CC: Annual Exam   HPI Peter Erickson presents for a CPX -  He feels well.  Outpatient Medications Prior to Visit  Medication Sig Dispense Refill   fexofenadine (ALLEGRA) 180 MG tablet Take 180 mg by mouth daily.     No facility-administered medications prior to visit.    ROS Review of Systems  Constitutional: Negative.  Negative for diaphoresis and fatigue.  HENT: Negative.    Eyes: Negative.   Respiratory:  Negative for cough, chest tightness and wheezing.   Cardiovascular:  Negative for chest pain, palpitations and leg swelling.  Gastrointestinal:  Negative for abdominal pain, diarrhea, nausea and vomiting.  Endocrine: Negative.   Genitourinary: Negative.  Negative for difficulty urinating, penile swelling, scrotal swelling and testicular pain.  Musculoskeletal: Negative.  Negative for arthralgias.  Skin: Negative.   Allergic/Immunologic: Negative.   Neurological: Negative.   Hematological:  Negative for adenopathy. Does not bruise/bleed easily.    Objective:  BP 118/76 (BP Location: Left Arm, Patient Position: Sitting, Cuff Size: Large)   Pulse 94   Temp 98.1 F (36.7 C) (Oral)   Ht 5\' 10"  (1.778 m)   Wt 183 lb (83 kg)   SpO2 98%   BMI 26.26 kg/m   BP Readings from Last 3 Encounters:  03/01/22 118/76  12/02/20 122/84  11/05/20 120/88    Wt Readings from Last 3 Encounters:  03/01/22 183 lb (83 kg)  12/02/20 174 lb (78.9 kg)  11/05/20 172 lb 12.8 oz (78.4 kg)    Physical Exam Vitals reviewed.  HENT:     Mouth/Throat:     Mouth: Mucous membranes are moist.  Eyes:     General: No scleral icterus.    Conjunctiva/sclera: Conjunctivae normal.  Cardiovascular:     Rate and Rhythm: Normal rate and regular rhythm.     Heart sounds: No murmur heard. Pulmonary:     Effort: Pulmonary effort is normal.     Breath sounds: No stridor. No wheezing, rhonchi or  rales.  Abdominal:     General: Abdomen is flat.     Palpations: There is no mass.     Tenderness: There is no abdominal tenderness. There is no guarding.     Hernia: No hernia is present.  Musculoskeletal:        General: Normal range of motion.     Cervical back: Neck supple.     Right lower leg: No edema.     Left lower leg: No edema.  Lymphadenopathy:     Cervical: No cervical adenopathy.  Skin:    General: Skin is warm and dry.  Neurological:     General: No focal deficit present.     Mental Status: He is alert. Mental status is at baseline.  Psychiatric:        Mood and Affect: Mood normal.        Behavior: Behavior normal.     Lab Results  Component Value Date   WBC 5.2 12/02/2020   HGB 14.3 12/02/2020   HCT 41.6 12/02/2020   PLT 205.0 12/02/2020   GLUCOSE 93 11/14/2019   CHOL 177 12/02/2020   TRIG 98.0 12/02/2020   HDL 57.30 12/02/2020   LDLCALC 100 (H) 12/02/2020   ALT 14 12/02/2020   AST 19 12/02/2020   NA 139 11/14/2019   K 4.5 11/14/2019   CL 102 11/14/2019   CREATININE 1.00 11/14/2019  BUN 16 11/14/2019   CO2 32 11/14/2019    US SCROTUM W/DOPPLER  Result Date: 12/08/2020 CLINICAL DATA:  Right scrotal mass EXAM: SCROTAL ULTRASOUND DOPPLER ULTRASOUND OF THE TESTICLES TECHNIQUE: Complete ultrasound examination of the testicles, epididymis, and other scrotal structures was performed. Color and spectral Doppler ultrasound were also utilized to evaluate blood flow to the testicles. COMPARISON:  None. FINDINGS: Right testicle Measurements: 5.7 x 3.2 x 3.4 cm. No mass or microlithiasis visualized. Left testicle Measurements: 4.3 x 2.5 x 3.4 cm. No mass or microlithiasis visualized. Right epididymis: Normal in size and appearance. Incidental benign subcentimeter cyst or spermatocele. Left epididymis:  Normal in size and appearance. Hydrocele:  None visualized. Varicocele:  None visualized. Pulsed Doppler interrogation of both testes demonstrates normal low  resistance arterial and venous waveforms bilaterally. IMPRESSION: 1. Normal size and ultrasound appearance of the bilateral testicles. No testicular mass. Normal, symmetric bilateral arterial and venous Doppler flow. 2. By sonographer report, palpable abnormality appears to correspond to the right epididymal head. Electronically Signed   By: Eddie Candle M.D.   On: 12/08/2020 14:06    Assessment & Plan:   Peter Erickson was seen today for annual exam.  Diagnoses and all orders for this visit:  Routine general medical examination at a health care facility - Exam completed, no labs or cancer screenings indicated, vaccines are UTD, pt ed material was given.   I have discontinued Peter Erickson's fexofenadine.  No orders of the defined types were placed in this encounter.    Follow-up: Return in about 1 year (around 03/02/2023).  Peter Calico, MD

## 2022-03-01 NOTE — Patient Instructions (Signed)
Health Maintenance, Male Adopting a healthy lifestyle and getting preventive care are important in promoting health and wellness. Ask your health care provider about: The right schedule for you to have regular tests and exams. Things you can do on your own to prevent diseases and keep yourself healthy. What should I know about diet, weight, and exercise? Eat a healthy diet  Eat a diet that includes plenty of vegetables, fruits, low-fat dairy products, and lean protein. Do not eat a lot of foods that are high in solid fats, added sugars, or sodium. Maintain a healthy weight Body mass index (BMI) is a measurement that can be used to identify possible weight problems. It estimates body fat based on height and weight. Your health care provider can help determine your BMI and help you achieve or maintain a healthy weight. Get regular exercise Get regular exercise. This is one of the most important things you can do for your health. Most adults should: Exercise for at least 150 minutes each week. The exercise should increase your heart rate and make you sweat (moderate-intensity exercise). Do strengthening exercises at least twice a week. This is in addition to the moderate-intensity exercise. Spend less time sitting. Even light physical activity can be beneficial. Watch cholesterol and blood lipids Have your blood tested for lipids and cholesterol at 44 years of age, then have this test every 5 years. You may need to have your cholesterol levels checked more often if: Your lipid or cholesterol levels are high. You are older than 44 years of age. You are at high risk for heart disease. What should I know about cancer screening? Many types of cancers can be detected early and may often be prevented. Depending on your health history and family history, you may need to have cancer screening at various ages. This may include screening for: Colorectal cancer. Prostate cancer. Skin cancer. Lung  cancer. What should I know about heart disease, diabetes, and high blood pressure? Blood pressure and heart disease High blood pressure causes heart disease and increases the risk of stroke. This is more likely to develop in people who have high blood pressure readings or are overweight. Talk with your health care provider about your target blood pressure readings. Have your blood pressure checked: Every 3-5 years if you are 18-39 years of age. Every year if you are 40 years old or older. If you are between the ages of 65 and 75 and are a current or former smoker, ask your health care provider if you should have a one-time screening for abdominal aortic aneurysm (AAA). Diabetes Have regular diabetes screenings. This checks your fasting blood sugar level. Have the screening done: Once every three years after age 45 if you are at a normal weight and have a low risk for diabetes. More often and at a younger age if you are overweight or have a high risk for diabetes. What should I know about preventing infection? Hepatitis B If you have a higher risk for hepatitis B, you should be screened for this virus. Talk with your health care provider to find out if you are at risk for hepatitis B infection. Hepatitis C Blood testing is recommended for: Everyone born from 1945 through 1965. Anyone with known risk factors for hepatitis C. Sexually transmitted infections (STIs) You should be screened each year for STIs, including gonorrhea and chlamydia, if: You are sexually active and are younger than 44 years of age. You are older than 44 years of age and your   health care provider tells you that you are at risk for this type of infection. Your sexual activity has changed since you were last screened, and you are at increased risk for chlamydia or gonorrhea. Ask your health care provider if you are at risk. Ask your health care provider about whether you are at high risk for HIV. Your health care provider  may recommend a prescription medicine to help prevent HIV infection. If you choose to take medicine to prevent HIV, you should first get tested for HIV. You should then be tested every 3 months for as long as you are taking the medicine. Follow these instructions at home: Alcohol use Do not drink alcohol if your health care provider tells you not to drink. If you drink alcohol: Limit how much you have to 0-2 drinks a day. Know how much alcohol is in your drink. In the [place] one drink equals one 12 oz bottle of beer (355 mL), one 5 oz glass of wine (148 mL), or one 1 oz glass of hard liquor (44 mL). Lifestyle Do not use any products that contain nicotine or tobacco. These products include cigarettes, chewing tobacco, and vaping devices, such as e-cigarettes. If you need help quitting, ask your health care provider. Do not use street drugs. Do not share needles. Ask your health care provider for help if you need support or information about quitting drugs. General instructions Schedule regular health, dental, and eye exams. Stay current with your vaccines. Tell your health care provider if: You often feel depressed. You have ever been abused or do not feel safe at home. Summary Adopting a healthy lifestyle and getting preventive care are important in promoting health and wellness. Follow your health care provider's instructions about healthy diet, exercising, and getting tested or screened for diseases. Follow your health care provider's instructions on monitoring your cholesterol and blood pressure. This information is not intended to replace advice given to you by your health care provider. Make sure you discuss any questions you have with your health care provider. Document Revised: 07/26/2020 Document Reviewed: 07/26/2020 Elsevier Patient Education  2023 [employer]  

## 2023-02-14 DIAGNOSIS — J189 Pneumonia, unspecified organism: Secondary | ICD-10-CM | POA: Diagnosis not present

## 2023-02-14 DIAGNOSIS — H6503 Acute serous otitis media, bilateral: Secondary | ICD-10-CM | POA: Diagnosis not present

## 2023-02-14 DIAGNOSIS — R051 Acute cough: Secondary | ICD-10-CM | POA: Diagnosis not present

## 2023-06-25 ENCOUNTER — Encounter: Payer: Self-pay | Admitting: Internal Medicine

## 2023-06-25 ENCOUNTER — Ambulatory Visit (INDEPENDENT_AMBULATORY_CARE_PROVIDER_SITE_OTHER): Admitting: Internal Medicine

## 2023-06-25 VITALS — BP 146/96 | HR 60 | Temp 98.0°F | Resp 16 | Ht 70.0 in | Wt 182.8 lb

## 2023-06-25 DIAGNOSIS — Z0001 Encounter for general adult medical examination with abnormal findings: Secondary | ICD-10-CM

## 2023-06-25 DIAGNOSIS — N451 Epididymitis: Secondary | ICD-10-CM

## 2023-06-25 DIAGNOSIS — R030 Elevated blood-pressure reading, without diagnosis of hypertension: Secondary | ICD-10-CM | POA: Diagnosis not present

## 2023-06-25 DIAGNOSIS — Z1211 Encounter for screening for malignant neoplasm of colon: Secondary | ICD-10-CM | POA: Insufficient documentation

## 2023-06-25 LAB — URINALYSIS, ROUTINE W REFLEX MICROSCOPIC
Bilirubin Urine: NEGATIVE
Hgb urine dipstick: NEGATIVE
Ketones, ur: NEGATIVE
Leukocytes,Ua: NEGATIVE
Nitrite: NEGATIVE
RBC / HPF: NONE SEEN (ref 0–?)
Specific Gravity, Urine: 1.01 (ref 1.000–1.030)
Total Protein, Urine: NEGATIVE
Urine Glucose: NEGATIVE
Urobilinogen, UA: 0.2 (ref 0.0–1.0)
pH: 7 (ref 5.0–8.0)

## 2023-06-25 LAB — BASIC METABOLIC PANEL WITH GFR
BUN: 11 mg/dL (ref 6–23)
CO2: 30 meq/L (ref 19–32)
Calcium: 9.7 mg/dL (ref 8.4–10.5)
Chloride: 102 meq/L (ref 96–112)
Creatinine, Ser: 0.89 mg/dL (ref 0.40–1.50)
GFR: 103.64 mL/min (ref >60.00)
Glucose, Bld: 92 mg/dL (ref 70–99)
Potassium: 3.9 meq/L (ref 3.5–5.1)
Sodium: 140 meq/L (ref 135–145)

## 2023-06-25 LAB — HEPATIC FUNCTION PANEL
ALT: 12 U/L (ref 0–53)
AST: 20 U/L (ref 0–37)
Albumin: 5.1 g/dL (ref 3.5–5.2)
Alkaline Phosphatase: 43 U/L (ref 39–117)
Bilirubin, Direct: 0.1 mg/dL (ref 0.0–0.3)
Total Bilirubin: 0.5 mg/dL (ref 0.2–1.2)
Total Protein: 7.7 g/dL (ref 6.0–8.3)

## 2023-06-25 LAB — CBC WITH DIFFERENTIAL/PLATELET
Basophils Absolute: 0.1 10*3/uL (ref 0.0–0.1)
Basophils Relative: 1.1 % (ref 0.0–3.0)
Eosinophils Absolute: 0.4 10*3/uL (ref 0.0–0.7)
Eosinophils Relative: 8.6 % — ABNORMAL HIGH (ref 0.0–5.0)
HCT: 42.7 % (ref 39.0–52.0)
Hemoglobin: 14.6 g/dL (ref 13.0–17.0)
Lymphocytes Relative: 41 % (ref 12.0–46.0)
Lymphs Abs: 2.2 10*3/uL (ref 0.7–4.0)
MCHC: 34.1 g/dL (ref 30.0–36.0)
MCV: 91.1 fl (ref 78.0–100.0)
Monocytes Absolute: 0.4 10*3/uL (ref 0.1–1.0)
Monocytes Relative: 7.8 % (ref 3.0–12.0)
Neutro Abs: 2.2 10*3/uL (ref 1.4–7.7)
Neutrophils Relative %: 41.5 % — ABNORMAL LOW (ref 43.0–77.0)
Platelets: 212 10*3/uL (ref 150.0–400.0)
RBC: 4.68 Mil/uL (ref 4.22–5.81)
RDW: 12.5 % (ref 11.5–15.5)
WBC: 5.2 10*3/uL (ref 4.0–10.5)

## 2023-06-25 LAB — TSH: TSH: 2.05 u[IU]/mL (ref 0.35–5.50)

## 2023-06-25 MED ORDER — SULFAMETHOXAZOLE-TRIMETHOPRIM 800-160 MG PO TABS: 1.0000 | ORAL_TABLET | Freq: Two times a day (BID) | ORAL | 0 refills | Status: AC

## 2023-06-25 NOTE — Patient Instructions (Signed)
 Health Maintenance, Male  Adopting a healthy lifestyle and getting preventive care are important in promoting health and wellness. Ask your health care provider about:  The right schedule for you to have regular tests and exams.  Things you can do on your own to prevent diseases and keep yourself healthy.  What should I know about diet, weight, and exercise?  Eat a healthy diet    Eat a diet that includes plenty of vegetables, fruits, low-fat dairy products, and lean protein.  Do not eat a lot of foods that are high in solid fats, added sugars, or sodium.  Maintain a healthy weight  Body mass index (BMI) is a measurement that can be used to identify possible weight problems. It estimates body fat based on height and weight. Your health care provider can help determine your BMI and help you achieve or maintain a healthy weight.  Get regular exercise  Get regular exercise. This is one of the most important things you can do for your health. Most adults should:  Exercise for at least 150 minutes each week. The exercise should increase your heart rate and make you sweat (moderate-intensity exercise).  Do strengthening exercises at least twice a week. This is in addition to the moderate-intensity exercise.  Spend less time sitting. Even light physical activity can be beneficial.  Watch cholesterol and blood lipids  Have your blood tested for lipids and cholesterol at 46 years of age, then have this test every 5 years.  You may need to have your cholesterol levels checked more often if:  Your lipid or cholesterol levels are high.  You are older than 46 years of age.  You are at high risk for heart disease.  What should I know about cancer screening?  Many types of cancers can be detected early and may often be prevented. Depending on your health history and family history, you may need to have cancer screening at various ages. This may include screening for:  Colorectal cancer.  Prostate cancer.  Skin cancer.  Lung  cancer.  What should I know about heart disease, diabetes, and high blood pressure?  Blood pressure and heart disease  High blood pressure causes heart disease and increases the risk of stroke. This is more likely to develop in people who have high blood pressure readings or are overweight.  Talk with your health care provider about your target blood pressure readings.  Have your blood pressure checked:  Every 3-5 years if you are 9-95 years of age.  Every year if you are 85 years old or older.  If you are between the ages of 29 and 29 and are a current or former smoker, ask your health care provider if you should have a one-time screening for abdominal aortic aneurysm (AAA).  Diabetes  Have regular diabetes screenings. This checks your fasting blood sugar level. Have the screening done:  Once every three years after age 23 if you are at a normal weight and have a low risk for diabetes.  More often and at a younger age if you are overweight or have a high risk for diabetes.  What should I know about preventing infection?  Hepatitis B  If you have a higher risk for hepatitis B, you should be screened for this virus. Talk with your health care provider to find out if you are at risk for hepatitis B infection.  Hepatitis C  Blood testing is recommended for:  Everyone born from [birth year] through 1965.  Anyone  with known risk factors for hepatitis C.  Sexually transmitted infections (STIs)  You should be screened each year for STIs, including gonorrhea and chlamydia, if:  You are sexually active and are younger than 46 years of age.  You are older than 46 years of age and your health care provider tells you that you are at risk for this type of infection.  Your sexual activity has changed since you were last screened, and you are at increased risk for chlamydia or gonorrhea. Ask your health care provider if you are at risk.  Ask your health care provider about whether you are at high risk for HIV. Your health care provider  may recommend a prescription medicine to help prevent HIV infection. If you choose to take medicine to prevent HIV, you should first get tested for HIV. You should then be tested every 3 months for as long as you are taking the medicine.  Follow these instructions at home:  Alcohol use  Do not drink alcohol if your health care provider tells you not to drink.  If you drink alcohol:  Limit how much you have to 0-2 drinks a day.  Know how much alcohol is in your drink. In the [place] one drink equals one 12 oz bottle of beer (355 mL), one 5 oz glass of wine (148 mL), or one 1 oz glass of hard liquor (44 mL).  Lifestyle  Do not use any products that contain nicotine or tobacco. These products include cigarettes, chewing tobacco, and vaping devices, such as e-cigarettes. If you need help quitting, ask your health care provider.  Do not use street drugs.  Do not share needles.  Ask your health care provider for help if you need support or information about quitting drugs.  General instructions  Schedule regular health, dental, and eye exams.  Stay current with your vaccines.  Tell your health care provider if:  You often feel depressed.  You have ever been abused or do not feel safe at home.  Summary  Adopting a healthy lifestyle and getting preventive care are important in promoting health and wellness.  Follow your health care provider's instructions about healthy diet, exercising, and getting tested or screened for diseases.  Follow your health care provider's instructions on monitoring your cholesterol and blood pressure.  This information is not intended to replace advice given to you by your health care provider. Make sure you discuss any questions you have with your health care provider.  Document Revised: 07/26/2020 Document Reviewed: 07/26/2020  Elsevier Patient Education  2024 [employer].

## 2023-06-25 NOTE — Progress Notes (Signed)
 Subjective:  Patient ID: Peter Erickson, male    DOB: 08-22-1975  Age: 48 y.o. MRN: [id number]  CC: Annual Exam and Hypertension   HPI Partick Musselman presents for a CPX and f/up ----  Discussed the use of AI scribe software for clinical note transcription with the patient, who gave verbal consent to proceed.  History of Present Illness   Peter Erickson is a 48 year old male who presents with elevated blood pressure and testicular discomfort.  No associated symptoms such as headaches, blurred vision, chest pain, or shortness of breath. He is physically active, recently completing a 30-mile bike ride without issues. He takes Allegra for allergies, which primarily affect his eyes this season, and is uncertain if he is taking Allegra D, which could affect blood pressure. His father has high blood pressure.  He has a recurrence of discomfort in the right testicle over the past few weeks, describing it as a generalized discomfort with possible swelling. No pain, dysuria, hematuria, or frequency. He has not taken any medication for this discomfort. He mentions having a vasectomy in the past and has always felt something in the area, which he assumed was related to the procedure.       No outpatient medications prior to visit.   No facility-administered medications prior to visit.    ROS Review of Systems  Constitutional:  Negative for chills, diaphoresis and fatigue.  HENT: Negative.  Negative for sinus pressure.   Eyes: Negative.   Respiratory: Negative.  Negative for cough, choking, shortness of breath and wheezing.   Cardiovascular:  Negative for chest pain, palpitations and leg swelling.  Gastrointestinal:  Negative for abdominal pain, constipation, diarrhea, nausea and vomiting.  Endocrine: Negative.   Genitourinary:  Positive for scrotal swelling. Negative for decreased urine volume, difficulty urinating, dysuria, penile pain, penile swelling, testicular pain and urgency.   Musculoskeletal: Negative.  Negative for arthralgias and myalgias.  Skin: Negative.  Negative for color change.  Neurological: Negative.  Negative for dizziness and weakness.  Hematological:  Negative for adenopathy. Does not bruise/bleed easily.  Psychiatric/Behavioral: Negative.      Objective:  BP (!) 146/96 (BP Location: Left Arm, Patient Position: Sitting, Cuff Size: Normal)   Pulse 60   Temp 98 F (36.7 C) (Oral)   Resp 16   Ht 5\' 10"  (1.778 m)   Wt 182 lb 12.8 oz (82.9 kg)   SpO2 99%   BMI 26.23 kg/m   BP Readings from Last 3 Encounters:  06/25/23 (!) 146/96  03/01/22 118/76  12/02/20 122/84    Wt Readings from Last 3 Encounters:  06/25/23 182 lb 12.8 oz (82.9 kg)  03/01/22 183 lb (83 kg)  12/02/20 174 lb (78.9 kg)    Physical Exam Vitals reviewed.  Constitutional:      Appearance: Normal appearance.  HENT:     Nose: Nose normal.     Mouth/Throat:     Mouth: Mucous membranes are moist.  Eyes:     General: No scleral icterus.    Conjunctiva/sclera: Conjunctivae normal.  Cardiovascular:     Rate and Rhythm: Regular rhythm. Bradycardia present.     Heart sounds: Normal heart sounds, S1 normal and S2 normal. No murmur heard.    No friction rub. No gallop.     Comments: EKG--- SB, 55 bpm No LVH, Q waves, or ST/T wave changes  Pulmonary:     Effort: Pulmonary effort is normal.     Breath sounds: No stridor. No wheezing, rhonchi or  rales.  Abdominal:     General: Abdomen is flat.     Palpations: There is no mass.     Tenderness: There is no abdominal tenderness. There is no guarding.     Hernia: No hernia is present. There is no hernia in the left inguinal area or right inguinal area.  Genitourinary:    Pubic Area: No rash.      Penis: Normal and circumcised.      Testes: Normal.        Right: Mass, tenderness, swelling, testicular hydrocele or varicocele not present.        Left: Mass, tenderness, swelling, testicular hydrocele or varicocele not  present.     Epididymis:     Right: Inflamed and enlarged. Mass present. No tenderness.     Left: Normal.     Comments: There is a 3 mm hard, round, non-tender mass embedded in the right epididymis. Musculoskeletal:     Cervical back: Neck supple.     Right lower leg: No edema.     Left lower leg: No edema.  Lymphadenopathy:     Cervical: No cervical adenopathy.     Lower Body: No right inguinal adenopathy. No left inguinal adenopathy.  Skin:    General: Skin is warm and dry.  Neurological:     General: No focal deficit present.     Mental Status: He is alert.  Psychiatric:        Mood and Affect: Mood normal.        Behavior: Behavior normal.     Lab Results  Component Value Date   WBC 5.2 06/25/2023   HGB 14.6 06/25/2023   HCT 42.7 06/25/2023   PLT 212.0 06/25/2023   GLUCOSE 92 06/25/2023   CHOL 177 12/02/2020   TRIG 98.0 12/02/2020   HDL 57.30 12/02/2020   LDLCALC 100 (H) 12/02/2020   ALT 12 06/25/2023   AST 20 06/25/2023   NA 140 06/25/2023   K 3.9 06/25/2023   CL 102 06/25/2023   CREATININE 0.89 06/25/2023   BUN 11 06/25/2023   CO2 30 06/25/2023   TSH 2.05 06/25/2023    US  SCROTUM W/DOPPLER Result Date: 12/08/2020 CLINICAL DATA:  Right scrotal mass EXAM: SCROTAL ULTRASOUND DOPPLER ULTRASOUND OF THE TESTICLES TECHNIQUE: Complete ultrasound examination of the testicles, epididymis, and other scrotal structures was performed. Color and spectral Doppler ultrasound were also utilized to evaluate blood flow to the testicles. COMPARISON:  None. FINDINGS: Right testicle Measurements: 5.7 x 3.2 x 3.4 cm. No mass or microlithiasis visualized. Left testicle Measurements: 4.3 x 2.5 x 3.4 cm. No mass or microlithiasis visualized. Right epididymis: Normal in size and appearance. Incidental benign subcentimeter cyst or spermatocele. Left epididymis:  Normal in size and appearance. Hydrocele:  None visualized. Varicocele:  None visualized. Pulsed Doppler interrogation of both  testes demonstrates normal low resistance arterial and venous waveforms bilaterally. IMPRESSION: 1. Normal size and ultrasound appearance of the bilateral testicles. No testicular mass. Normal, symmetric bilateral arterial and venous Doppler flow. 2. By sonographer report, palpable abnormality appears to correspond to the right epididymal head. Electronically Signed   By: Hubbard Mad M.D.   On: 12/08/2020 14:06    Assessment & Plan:   Encounter for general adult medical examination with abnormal findings- Exam completed, labs reviewed, vaccines reviewed, cancer screenings addressed, pt ed material was given.   Right epididymitis -     Urinalysis, Routine w reflex microscopic; Future -     Sulfamethoxazole-Trimethoprim; Take 1 tablet by  mouth 2 (two) times daily for 10 days.  Dispense: 20 tablet; Refill: 0  Elevated blood pressure reading- Labs are negative for secondary causes and end organ damage. EKG is negative for LVH. Will recheck his BP in 3 months. -     EKG 12-Lead -     Basic metabolic panel with GFR; Future -     Aldosterone + renin activity w/ ratio; Future -     TSH; Future -     Urinalysis, Routine w reflex microscopic; Future -     CBC with Differential/Platelet; Future -     Hepatic function panel; Future  Screening for colon cancer -     Cologuard     Follow-up: Return in about 3 months (around 09/24/2023).  Sandra Crouch, MD

## 2023-06-29 ENCOUNTER — Encounter: Payer: Self-pay | Admitting: Internal Medicine

## 2023-06-29 LAB — ALDOSTERONE + RENIN ACTIVITY W/ RATIO
ALDO / PRA Ratio: 3.8 ratio (ref 0.9–28.9)
Aldosterone: 3 ng/dL
Renin Activity: 0.79 ng/mL/h (ref 0.25–5.82)

## 2023-07-17 ENCOUNTER — Encounter: Admitting: Internal Medicine

## 2023-07-17 DIAGNOSIS — Z1211 Encounter for screening for malignant neoplasm of colon: Secondary | ICD-10-CM | POA: Diagnosis not present

## 2023-07-18 IMAGING — US US SCROTUM W/ DOPPLER COMPLETE
1 series · 14 of 25 positions shown · non-contrast
Comparison: None.

CLINICAL DATA: Right scrotal mass

EXAM:
SCROTAL ULTRASOUND
DOPPLER ULTRASOUND OF THE TESTICLES
TECHNIQUE: Complete ultrasound examination of the testicles, epididymis, and
other scrotal structures was performed. Color and spectral Doppler
ultrasound were also utilized to evaluate blood flow to the
testicles.

[Series 1: us scrotum w/ doppler complete · 0.06mm/px · 14 of 48 slices shown]
[im 1/48]
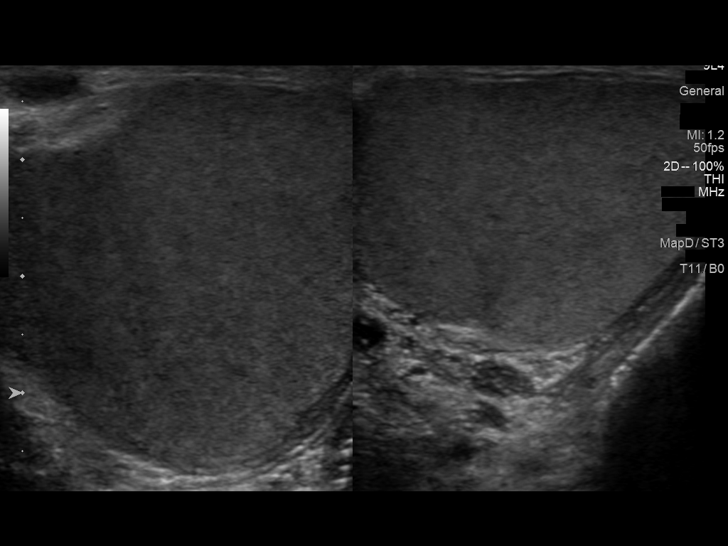
[im 4/48]
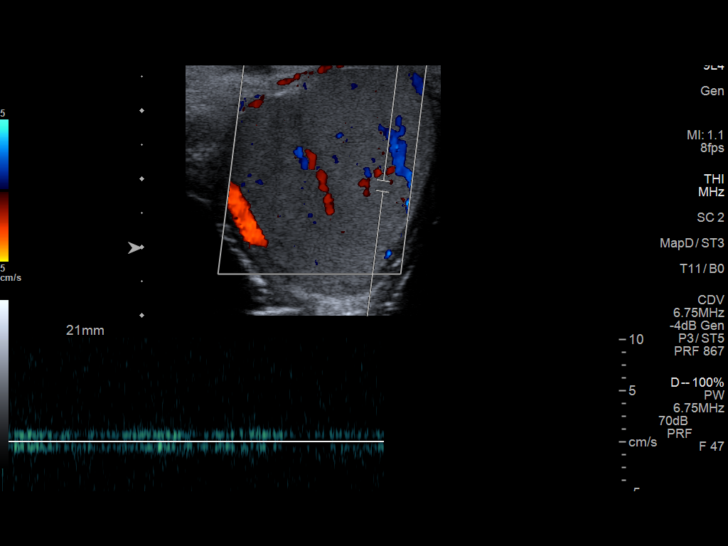
[im 8/48]
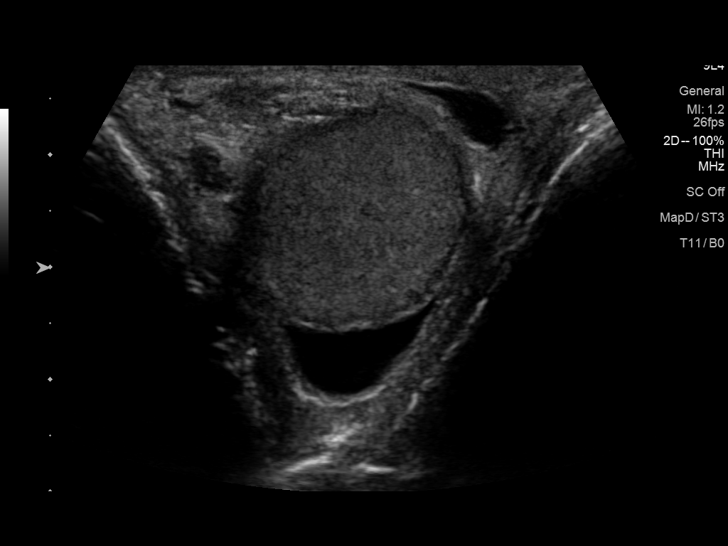
[im 12/48]
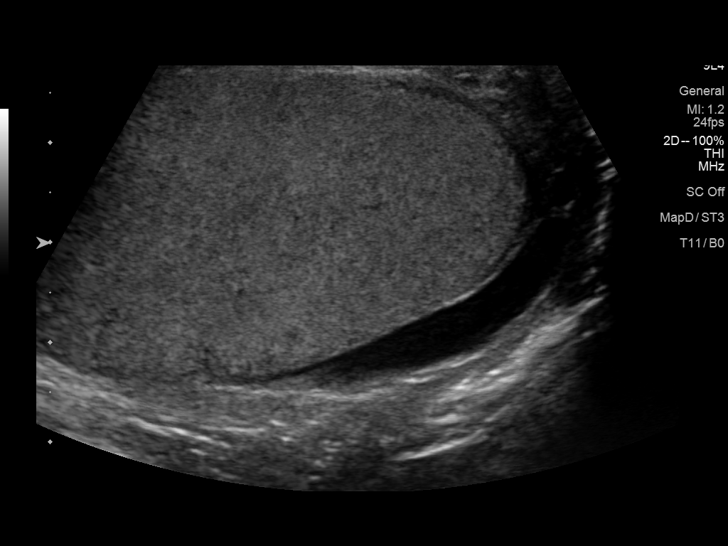
[im 16/48]
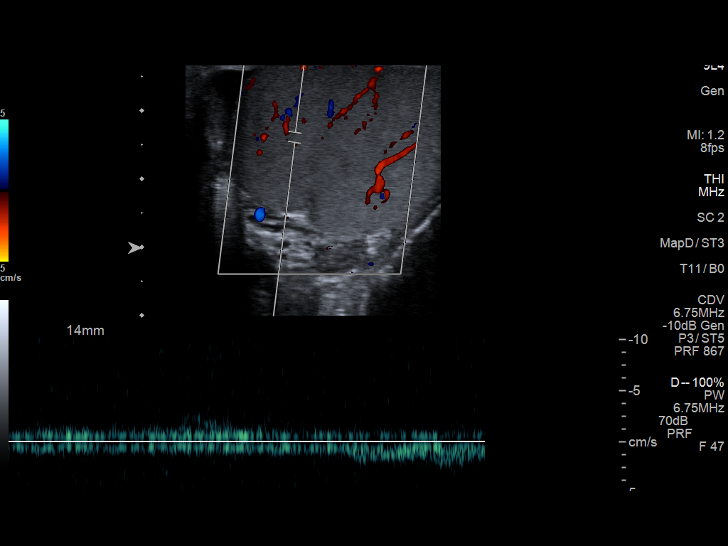
[im 18/48]
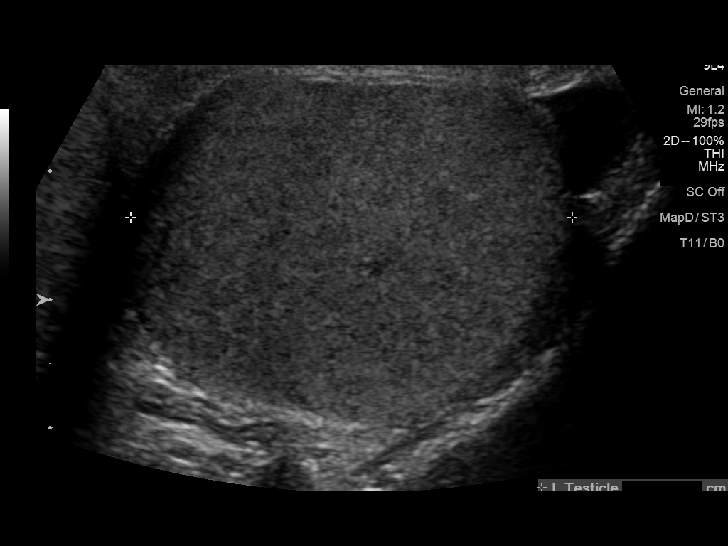
[im 22/48]
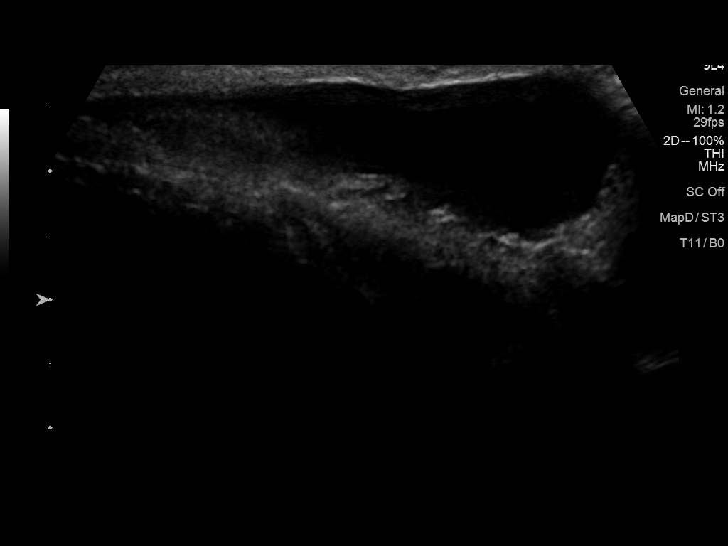
[im 26/48]
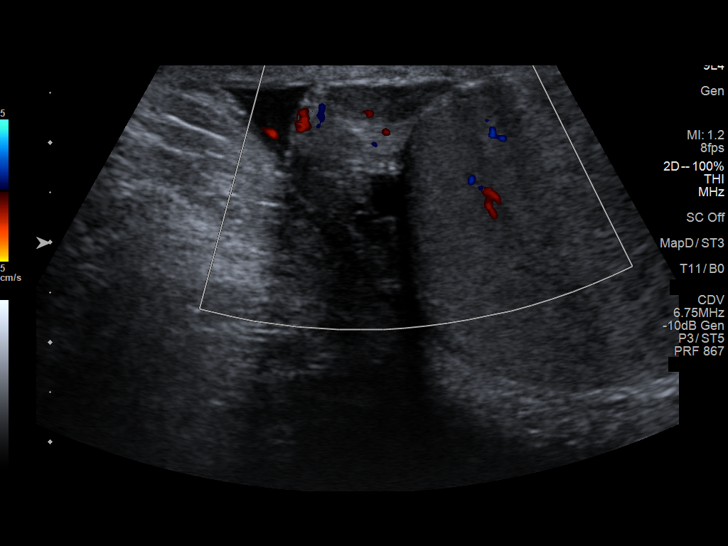
[im 30/48]
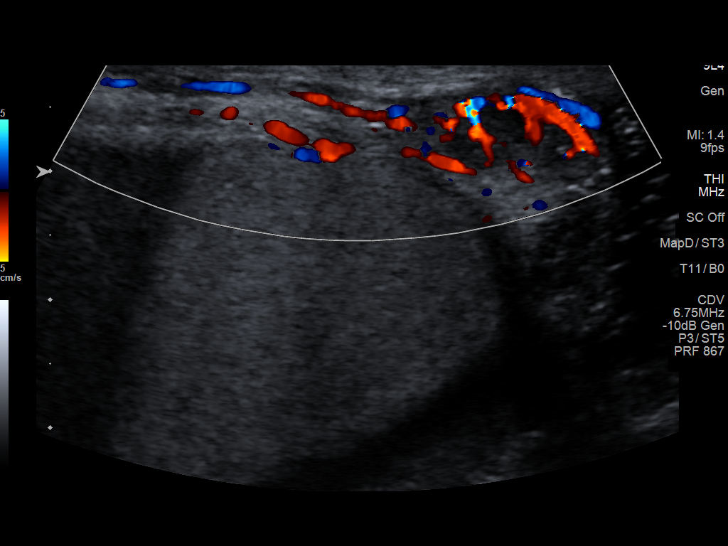
[im 32/48]
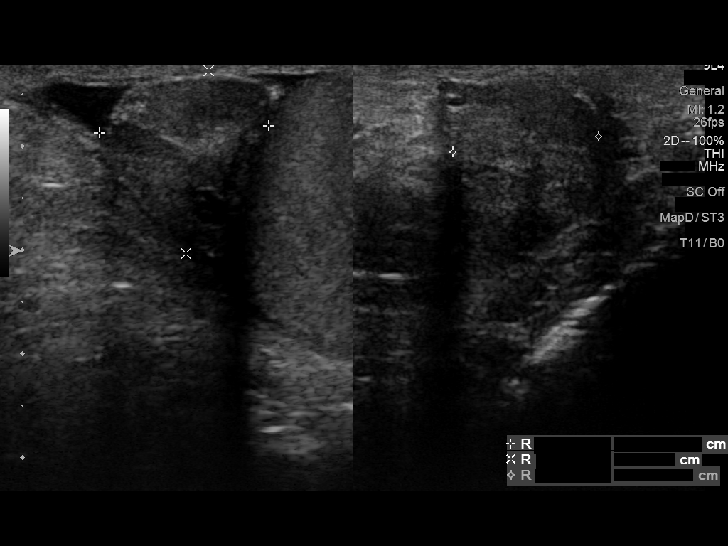
[im 36/48]
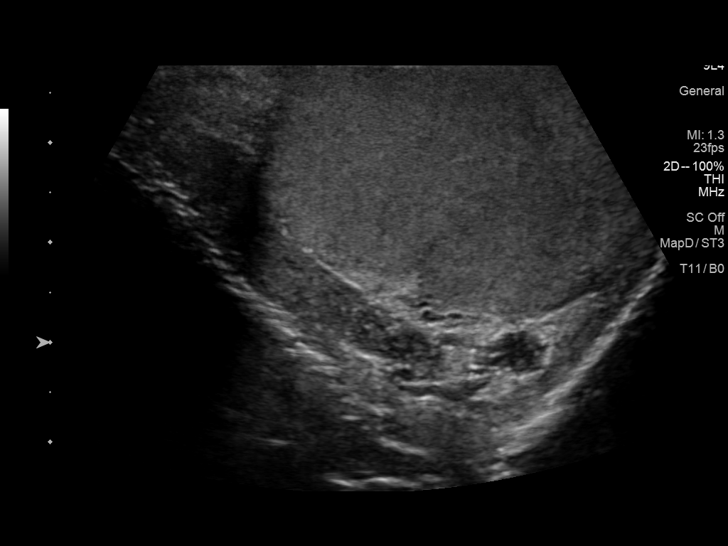
[im 40/48]
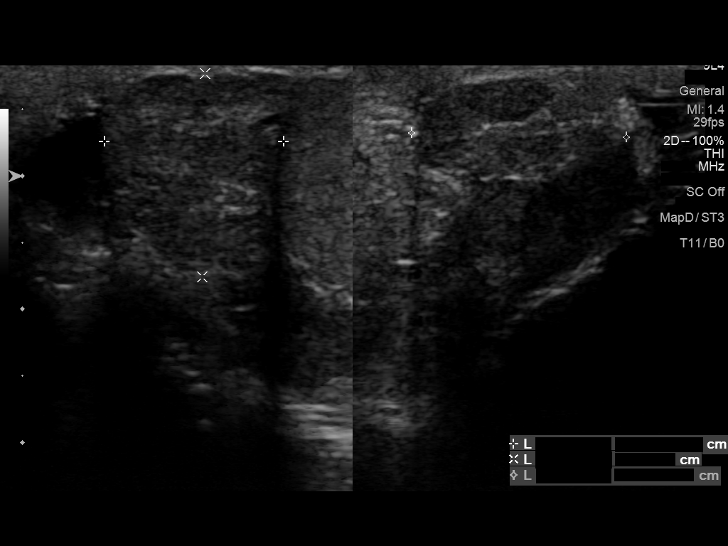
[im 44/48]
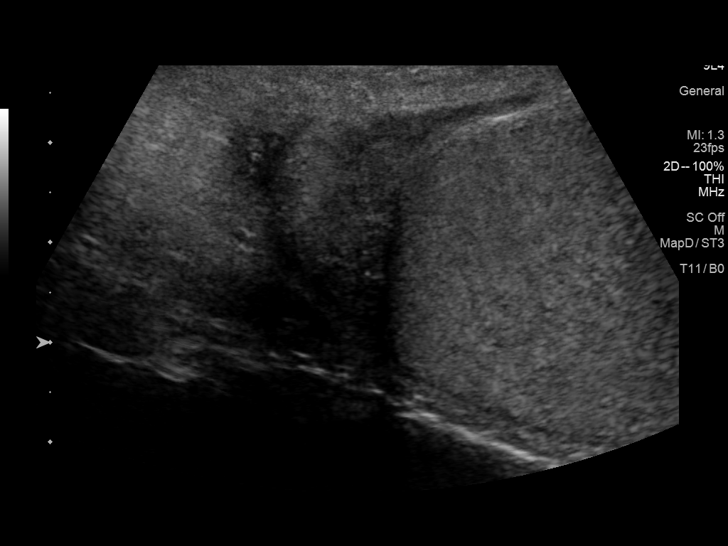
[im 48/48]
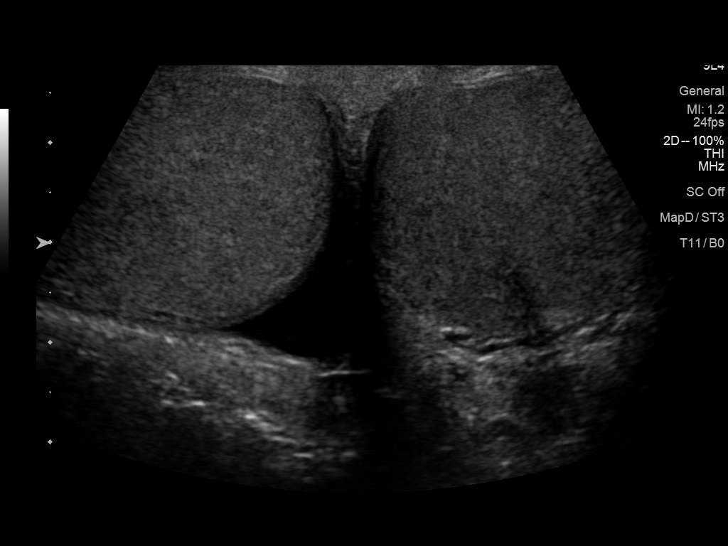

[14 of 25 positions shown; findings below may reference images not displayed]

FINDINGS: Right testicle

Measurements: 5.7 x 3.2 x 3.4 cm. No mass or microlithiasis
visualized.

Left testicle

Measurements: 4.3 x 2.5 x 3.4 cm. No mass or microlithiasis
visualized.

Right epididymis: Normal in size and appearance. Incidental benign
subcentimeter cyst or spermatocele.

Left epididymis:  Normal in size and appearance.

Hydrocele:  None visualized.

Varicocele:  None visualized.

Pulsed Doppler interrogation of both testes demonstrates normal low
resistance arterial and venous waveforms bilaterally.
IMPRESSION: 1. Normal size and ultrasound appearance of the bilateral testicles.
No testicular mass. Normal, symmetric bilateral arterial and venous
Doppler flow.
2. By sonographer report, palpable abnormality appears to correspond
to the right epididymal head.

## 2023-07-23 LAB — COLOGUARD: COLOGUARD: NEGATIVE

## 2023-08-16 ENCOUNTER — Encounter: Payer: Self-pay | Admitting: Internal Medicine

## 2023-08-17 ENCOUNTER — Other Ambulatory Visit: Payer: Self-pay | Admitting: Internal Medicine

## 2023-08-17 DIAGNOSIS — I10 Essential (primary) hypertension: Secondary | ICD-10-CM

## 2023-08-17 MED ORDER — OLMESARTAN MEDOXOMIL 20 MG PO TABS: 20.0000 mg | ORAL_TABLET | Freq: Every day | ORAL | 0 refills | Status: DC

## 2023-08-17 NOTE — Progress Notes (Unsigned)
 Lab Results  Component Value Date   WBC 5.2 06/25/2023   HGB 14.6 06/25/2023   HCT 42.7 06/25/2023   PLT 212.0 06/25/2023   GLUCOSE 92 06/25/2023   CHOL 177 12/02/2020   TRIG 98.0 12/02/2020   HDL 57.30 12/02/2020   LDLCALC 100 (H) 12/02/2020   ALT 12 06/25/2023   AST 20 06/25/2023   NA 140 06/25/2023   K 3.9 06/25/2023   CL 102 06/25/2023   CREATININE 0.89 06/25/2023   BUN 11 06/25/2023   CO2 30 06/25/2023   TSH 2.05 06/25/2023

## 2023-09-25 ENCOUNTER — Ambulatory Visit: Admitting: Internal Medicine

## 2023-09-26 ENCOUNTER — Other Ambulatory Visit: Payer: Self-pay | Admitting: Internal Medicine

## 2023-09-26 DIAGNOSIS — I10 Essential (primary) hypertension: Secondary | ICD-10-CM

## 2023-12-25 ENCOUNTER — Encounter: Payer: Self-pay | Admitting: Internal Medicine

## 2024-01-16 ENCOUNTER — Other Ambulatory Visit: Payer: Self-pay | Admitting: Internal Medicine

## 2024-02-15 ENCOUNTER — Other Ambulatory Visit: Payer: Self-pay | Admitting: Internal Medicine

## 2024-02-15 DIAGNOSIS — I10 Essential (primary) hypertension: Secondary | ICD-10-CM

## 2024-02-16 ENCOUNTER — Other Ambulatory Visit: Payer: Self-pay | Admitting: Internal Medicine

## 2024-02-18 ENCOUNTER — Other Ambulatory Visit: Payer: Self-pay | Admitting: Internal Medicine

## 2024-02-18 DIAGNOSIS — I10 Essential (primary) hypertension: Secondary | ICD-10-CM

## 2024-03-18 ENCOUNTER — Other Ambulatory Visit: Payer: Self-pay | Admitting: Internal Medicine

## 2024-03-18 DIAGNOSIS — I10 Essential (primary) hypertension: Secondary | ICD-10-CM

## 2024-03-24 ENCOUNTER — Encounter: Payer: Self-pay | Admitting: Internal Medicine

## 2024-03-26 ENCOUNTER — Other Ambulatory Visit: Payer: Self-pay

## 2024-03-26 ENCOUNTER — Other Ambulatory Visit: Payer: Self-pay | Admitting: Internal Medicine

## 2024-03-26 DIAGNOSIS — I10 Essential (primary) hypertension: Secondary | ICD-10-CM

## 2024-03-26 MED ORDER — OLMESARTAN MEDOXOMIL 20 MG PO TABS: ORAL_TABLET | ORAL | 0 refills | Status: DC

## 2024-03-27 ENCOUNTER — Other Ambulatory Visit: Payer: Self-pay | Admitting: Internal Medicine

## 2024-03-27 DIAGNOSIS — I10 Essential (primary) hypertension: Secondary | ICD-10-CM

## 2024-04-21 ENCOUNTER — Other Ambulatory Visit: Payer: Self-pay

## 2024-04-21 ENCOUNTER — Other Ambulatory Visit: Payer: Self-pay | Admitting: Internal Medicine

## 2024-04-21 DIAGNOSIS — I10 Essential (primary) hypertension: Secondary | ICD-10-CM

## 2024-04-21 MED ORDER — OLMESARTAN MEDOXOMIL 20 MG PO TABS: ORAL_TABLET | ORAL | 0 refills | Status: DC

## 2024-04-22 ENCOUNTER — Ambulatory Visit: Admitting: Internal Medicine

## 2024-05-21 ENCOUNTER — Ambulatory Visit: Admitting: Internal Medicine

## 2024-07-01 ENCOUNTER — Encounter: Admitting: Internal Medicine
